# Patient Record
Sex: Male | Born: 1935 | Race: White | Hispanic: No | Smoking: Former smoker
Health system: Southern US, Community
[De-identification: ages and names within clinical notes are randomized; demographics above are authoritative.]

## PROBLEM LIST (undated history)

## (undated) DIAGNOSIS — F0390 Unspecified dementia without behavioral disturbance: Secondary | ICD-10-CM

## (undated) DIAGNOSIS — Z950 Presence of cardiac pacemaker: Secondary | ICD-10-CM

## (undated) DIAGNOSIS — J449 Chronic obstructive pulmonary disease, unspecified: Secondary | ICD-10-CM

## (undated) DIAGNOSIS — N390 Urinary tract infection, site not specified: Secondary | ICD-10-CM

## (undated) DIAGNOSIS — I671 Cerebral aneurysm, nonruptured: Secondary | ICD-10-CM

## (undated) DIAGNOSIS — I509 Heart failure, unspecified: Secondary | ICD-10-CM

## (undated) DIAGNOSIS — IMO0002 Reserved for concepts with insufficient information to code with codable children: Secondary | ICD-10-CM

## (undated) DIAGNOSIS — Y92009 Unspecified place in unspecified non-institutional (private) residence as the place of occurrence of the external cause: Secondary | ICD-10-CM

## (undated) DIAGNOSIS — I2510 Atherosclerotic heart disease of native coronary artery without angina pectoris: Secondary | ICD-10-CM

## (undated) DIAGNOSIS — I38 Endocarditis, valve unspecified: Secondary | ICD-10-CM

## (undated) DIAGNOSIS — M549 Dorsalgia, unspecified: Secondary | ICD-10-CM

## (undated) DIAGNOSIS — I4891 Unspecified atrial fibrillation: Secondary | ICD-10-CM

## (undated) DIAGNOSIS — M1990 Unspecified osteoarthritis, unspecified site: Secondary | ICD-10-CM

## (undated) DIAGNOSIS — F17210 Nicotine dependence, cigarettes, uncomplicated: Secondary | ICD-10-CM

## (undated) DIAGNOSIS — N400 Enlarged prostate without lower urinary tract symptoms: Secondary | ICD-10-CM

## (undated) DIAGNOSIS — I219 Acute myocardial infarction, unspecified: Secondary | ICD-10-CM

## (undated) DIAGNOSIS — S065X9A Traumatic subdural hemorrhage with loss of consciousness of unspecified duration, initial encounter: Secondary | ICD-10-CM

## (undated) DIAGNOSIS — Z85828 Personal history of other malignant neoplasm of skin: Secondary | ICD-10-CM

## (undated) DIAGNOSIS — D510 Vitamin B12 deficiency anemia due to intrinsic factor deficiency: Secondary | ICD-10-CM

## (undated) DIAGNOSIS — W19XXXA Unspecified fall, initial encounter: Secondary | ICD-10-CM

## (undated) DIAGNOSIS — S72009A Fracture of unspecified part of neck of unspecified femur, initial encounter for closed fracture: Secondary | ICD-10-CM

## (undated) DIAGNOSIS — K219 Gastro-esophageal reflux disease without esophagitis: Secondary | ICD-10-CM

## (undated) DIAGNOSIS — S065XAA Traumatic subdural hemorrhage with loss of consciousness status unknown, initial encounter: Secondary | ICD-10-CM

## (undated) DIAGNOSIS — I10 Essential (primary) hypertension: Secondary | ICD-10-CM

## (undated) HISTORY — DX: Acute myocardial infarction, unspecified: I21.9

## (undated) HISTORY — PX: HERNIA REPAIR: SHX51

## (undated) HISTORY — PX: ANGIOPLASTY: SHX39

## (undated) HISTORY — PX: CORONARY ANGIOPLASTY: SHX604

## (undated) HISTORY — DX: Benign prostatic hyperplasia without lower urinary tract symptoms: N40.0

## (undated) HISTORY — DX: Essential (primary) hypertension: I10

## (undated) HISTORY — DX: Chronic obstructive pulmonary disease, unspecified: J44.9

## (undated) HISTORY — DX: Urinary tract infection, site not specified: N39.0

## (undated) HISTORY — DX: Unspecified fall, initial encounter: W19.XXXA

## (undated) HISTORY — DX: Gastro-esophageal reflux disease without esophagitis: K21.9

## (undated) HISTORY — PX: FINGER AMPUTATION: SHX636

## (undated) HISTORY — DX: Vitamin B12 deficiency anemia due to intrinsic factor deficiency: D51.0

## (undated) HISTORY — PX: CARDIAC CATHETERIZATION: SHX172

## (undated) HISTORY — DX: Unspecified place in unspecified non-institutional (private) residence as the place of occurrence of the external cause: Y92.009

## (undated) HISTORY — DX: Fracture of unspecified part of neck of unspecified femur, initial encounter for closed fracture: S72.009A

## (undated) HISTORY — PX: BURR HOLE FOR SUBDURAL HEMATOMA: SHX1275

## (undated) HISTORY — DX: Unspecified dementia, unspecified severity, without behavioral disturbance, psychotic disturbance, mood disturbance, and anxiety: F03.90

## (undated) HISTORY — PX: PARTIAL GASTRECTOMY: SHX2172

## (undated) HISTORY — DX: Traumatic subdural hemorrhage with loss of consciousness status unknown, initial encounter: S06.5XAA

## (undated) HISTORY — DX: Endocarditis, valve unspecified: I38

## (undated) HISTORY — DX: Reserved for concepts with insufficient information to code with codable children: IMO0002

## (undated) HISTORY — DX: Unspecified osteoarthritis, unspecified site: M19.90

## (undated) HISTORY — DX: Presence of cardiac pacemaker: Z95.0

## (undated) HISTORY — PX: HIP SURGERY: SHX245

## (undated) HISTORY — PX: CHOLECYSTECTOMY: SHX55

## (undated) HISTORY — DX: Dorsalgia, unspecified: M54.9

## (undated) HISTORY — PX: OTHER SURGICAL HISTORY: SHX169

## (undated) HISTORY — DX: Nicotine dependence, cigarettes, uncomplicated: F17.210

## (undated) HISTORY — PX: INSERT / REPLACE / REMOVE PACEMAKER: SUR710

## (undated) HISTORY — PX: PACEMAKER PLACEMENT: SHX43

## (undated) HISTORY — DX: Atherosclerotic heart disease of native coronary artery without angina pectoris: I25.10

## (undated) HISTORY — DX: Unspecified atrial fibrillation: I48.91

## (undated) HISTORY — DX: Cerebral aneurysm, nonruptured: I67.1

## (undated) HISTORY — DX: Heart failure, unspecified: I50.9

## (undated) HISTORY — DX: Personal history of other malignant neoplasm of skin: Z85.828

## (undated) HISTORY — DX: Traumatic subdural hemorrhage with loss of consciousness of unspecified duration, initial encounter: S06.5X9A

---

## 2012-10-27 ENCOUNTER — Encounter: Payer: Self-pay | Admitting: Family Medicine

## 2012-12-18 ENCOUNTER — Encounter (HOSPITAL_COMMUNITY): Payer: Self-pay | Admitting: *Deleted

## 2012-12-18 ENCOUNTER — Emergency Department (HOSPITAL_COMMUNITY)
Admission: EM | Admit: 2012-12-18 | Discharge: 2012-12-18 | Disposition: A | Discharge status: Home / Self Care | Payer: Medicare Other | Attending: Emergency Medicine | Admitting: Emergency Medicine

## 2012-12-18 ENCOUNTER — Emergency Department (HOSPITAL_COMMUNITY): Payer: Medicare Other

## 2012-12-18 DIAGNOSIS — J441 Chronic obstructive pulmonary disease with (acute) exacerbation: Secondary | ICD-10-CM

## 2012-12-18 DIAGNOSIS — K219 Gastro-esophageal reflux disease without esophagitis: Secondary | ICD-10-CM | POA: Insufficient documentation

## 2012-12-18 DIAGNOSIS — F0390 Unspecified dementia without behavioral disturbance: Secondary | ICD-10-CM | POA: Insufficient documentation

## 2012-12-18 DIAGNOSIS — I4891 Unspecified atrial fibrillation: Secondary | ICD-10-CM | POA: Insufficient documentation

## 2012-12-18 DIAGNOSIS — I10 Essential (primary) hypertension: Secondary | ICD-10-CM | POA: Insufficient documentation

## 2012-12-18 DIAGNOSIS — Z9861 Coronary angioplasty status: Secondary | ICD-10-CM | POA: Insufficient documentation

## 2012-12-18 DIAGNOSIS — F17200 Nicotine dependence, unspecified, uncomplicated: Secondary | ICD-10-CM | POA: Insufficient documentation

## 2012-12-18 DIAGNOSIS — Z85828 Personal history of other malignant neoplasm of skin: Secondary | ICD-10-CM | POA: Insufficient documentation

## 2012-12-18 DIAGNOSIS — Z8679 Personal history of other diseases of the circulatory system: Secondary | ICD-10-CM | POA: Insufficient documentation

## 2012-12-18 DIAGNOSIS — I2510 Atherosclerotic heart disease of native coronary artery without angina pectoris: Secondary | ICD-10-CM | POA: Insufficient documentation

## 2012-12-18 DIAGNOSIS — I252 Old myocardial infarction: Secondary | ICD-10-CM | POA: Insufficient documentation

## 2012-12-18 DIAGNOSIS — Z880 Allergy status to penicillin: Secondary | ICD-10-CM | POA: Insufficient documentation

## 2012-12-18 DIAGNOSIS — Z862 Personal history of diseases of the blood and blood-forming organs and certain disorders involving the immune mechanism: Secondary | ICD-10-CM | POA: Insufficient documentation

## 2012-12-18 DIAGNOSIS — M129 Arthropathy, unspecified: Secondary | ICD-10-CM | POA: Insufficient documentation

## 2012-12-18 DIAGNOSIS — R059 Cough, unspecified: Secondary | ICD-10-CM | POA: Insufficient documentation

## 2012-12-18 DIAGNOSIS — IMO0002 Reserved for concepts with insufficient information to code with codable children: Secondary | ICD-10-CM | POA: Insufficient documentation

## 2012-12-18 DIAGNOSIS — Z8739 Personal history of other diseases of the musculoskeletal system and connective tissue: Secondary | ICD-10-CM | POA: Insufficient documentation

## 2012-12-18 DIAGNOSIS — Z950 Presence of cardiac pacemaker: Secondary | ICD-10-CM | POA: Insufficient documentation

## 2012-12-18 DIAGNOSIS — Z79899 Other long term (current) drug therapy: Secondary | ICD-10-CM | POA: Insufficient documentation

## 2012-12-18 DIAGNOSIS — I509 Heart failure, unspecified: Secondary | ICD-10-CM | POA: Insufficient documentation

## 2012-12-18 DIAGNOSIS — R05 Cough: Secondary | ICD-10-CM | POA: Insufficient documentation

## 2012-12-18 DIAGNOSIS — J438 Other emphysema: Secondary | ICD-10-CM | POA: Insufficient documentation

## 2012-12-18 DIAGNOSIS — Z87442 Personal history of urinary calculi: Secondary | ICD-10-CM | POA: Insufficient documentation

## 2012-12-18 DIAGNOSIS — Z87448 Personal history of other diseases of urinary system: Secondary | ICD-10-CM | POA: Insufficient documentation

## 2012-12-18 LAB — CBC WITH DIFFERENTIAL/PLATELET
Basophils Absolute: 0 10*3/uL (ref 0.0–0.1)
Eosinophils Relative: 6 % — ABNORMAL HIGH (ref 0–5)
HCT: 36 % — ABNORMAL LOW (ref 39.0–52.0)
Hemoglobin: 12.7 g/dL — ABNORMAL LOW (ref 13.0–17.0)
Lymphocytes Relative: 13 % (ref 12–46)
Lymphs Abs: 1.1 10*3/uL (ref 0.7–4.0)
MCV: 92.5 fL (ref 78.0–100.0)
Monocytes Absolute: 0.8 10*3/uL (ref 0.1–1.0)
Monocytes Relative: 9 % (ref 3–12)
Neutro Abs: 6.1 10*3/uL (ref 1.7–7.7)
RDW: 14.9 % (ref 11.5–15.5)
WBC: 8.4 10*3/uL (ref 4.0–10.5)

## 2012-12-18 LAB — BASIC METABOLIC PANEL
BUN: 18 mg/dL (ref 6–23)
Calcium: 8.5 mg/dL (ref 8.4–10.5)
Chloride: 104 mEq/L (ref 96–112)
Creatinine, Ser: 1.06 mg/dL (ref 0.50–1.35)
GFR calc Af Amer: 70 mL/min — ABNORMAL LOW (ref >90)

## 2012-12-18 LAB — LACTIC ACID, PLASMA: Lactic Acid, Venous: 1.5 mmol/L (ref 0.5–2.2)

## 2012-12-18 MED ORDER — LEVOFLOXACIN 500 MG PO TABS
500.0000 mg | ORAL_TABLET | Freq: Once | ORAL | Status: AC
  Administered 2012-12-18: 500 mg via ORAL
  Filled 2012-12-18: qty 1

## 2012-12-18 MED ORDER — IPRATROPIUM BROMIDE 0.02 % IN SOLN
0.5000 mg | Freq: Once | RESPIRATORY_TRACT | Status: AC
  Administered 2012-12-18: 0.5 mg via RESPIRATORY_TRACT
  Filled 2012-12-18: qty 2.5

## 2012-12-18 MED ORDER — ALBUTEROL SULFATE (5 MG/ML) 0.5% IN NEBU
5.0000 mg | INHALATION_SOLUTION | Freq: Once | RESPIRATORY_TRACT | Status: AC
  Administered 2012-12-18: 5 mg via RESPIRATORY_TRACT
  Filled 2012-12-18: qty 1

## 2012-12-18 MED ORDER — LEVOFLOXACIN 500 MG PO TABS: 500.0000 mg | ORAL_TABLET | Freq: Once | ORAL | Status: DC

## 2012-12-18 MED ORDER — PREDNISONE 10 MG PO TABS: 60.0000 mg | ORAL_TABLET | Freq: Every day | ORAL | Status: DC

## 2012-12-18 MED ORDER — PREDNISONE 20 MG PO TABS
60.0000 mg | ORAL_TABLET | Freq: Once | ORAL | Status: AC
  Administered 2012-12-18: 60 mg via ORAL
  Filled 2012-12-18: qty 3

## 2012-12-18 NOTE — ED Notes (Signed)
Pt's family reports progressively worse shortness of breath x2 days, productive cough w/ clear/yellow sputum. Pt w/o any known fever. Pt denies chest pain. Pt is A&OX4, in no acute distress.

## 2012-12-18 NOTE — ED Notes (Signed)
See this RN's triage note.  

## 2012-12-18 NOTE — ED Notes (Signed)
D/c instructions reviewed w/ pt and family - pt and family deny any further questions or concerns at present. Pt ambulating independently w/ steady gait on d/c in no acute distress, A&Ox4. Rx given x2  

## 2012-12-18 NOTE — ED Provider Notes (Signed)
CSN: [id number]     Arrival date & time 12/18/12  4098 History   First MD Initiated Contact with Patient 12/18/12 0354     Chief Complaint  Patient presents with  . Shortness of Breath    The history is provided by the patient and a relative.   patient reports worsening shortness breath her the past 48 hours.  He said a productive cough.  He has a history of COPD/emphysema.  He continues to smoke cigarettes.  He tried his breathing treatment without improvement.  He has not seen a primary care physician.  Patient has a demand pacer and history of A. fib.  He denies orthopnea.  No unilateral leg swelling.  Denies nausea vomiting or diarrhea.  No fevers or chills at home.  Past Medical History  Diagnosis Date  . A-fib   . Pacemaker   . CAD (coronary artery disease)     s/p stent placement  . CHF (congestive heart failure)     EF 50% on echo 2013, 44% on nuclear stufy 07/2011  . Anemia, pernicious   . Arthritis   . Back pain   . BPH (benign prostatic hyperplasia)   . Cerebral aneurysm   . COPD (chronic obstructive pulmonary disease)   . DDD (degenerative disc disease)   . GERD (gastroesophageal reflux disease)     h/o peptic ulcer  . HTN (hypertension)   . Renal stones   . Subdural hematoma   . History of skin cancer   . Cigarette smoker   . Valvular disease     MR/TR on echo prev  . Dementia   . MI (myocardial infarction)    Past Surgical History  Procedure Laterality Date  . Finger amputation      traumatic  . Angioplasty    . Hernia repair    . Burr hole for subdural hematoma      with evacuation  . Pacemaker placement      biotronik 10/12/09  . Transurethral resection of prostate    . Partial gastrectomy    . Coronary artery bypass graft     History reviewed. No pertinent family history. History  Substance Use Topics  . Smoking status: Current Every Day Smoker -- 1.00 packs/day    Types: Cigarettes  . Smokeless tobacco: Not on file  . Alcohol Use: No     Review of Systems  Respiratory: Positive for shortness of breath.   All other systems reviewed and are negative.    Allergies  Codeine; Flecainide; Morphine and related; Multaq; and Penicillins  Home Medications   Current Outpatient Rx  Name  Route  Sig  Dispense  Refill  . albuterol (PROVENTIL HFA;VENTOLIN HFA) 108 (90 BASE) MCG/ACT inhaler   Inhalation   Inhale 2 puffs into the lungs every 6 (six) hours as needed for wheezing.         . digoxin (LANOXIN) 0.125 MG tablet   Oral   Take 0.125 mg by mouth daily.         Marland Kitchen diltiazem (CARDIZEM CD) 120 MG 24 hr capsule   Oral   Take 120 mg by mouth daily.         Marland Kitchen docusate sodium (COLACE) 100 MG capsule   Oral   Take 100 mg by mouth 2 (two) times daily.         Marland Kitchen donepezil (ARICEPT) 5 MG tablet   Oral   Take 5 mg by mouth at bedtime.         Marland Kitchen  metoprolol succinate (TOPROL-XL) 25 MG 24 hr tablet   Oral   Take 25 mg by mouth daily.         . nabumetone (RELAFEN) 500 MG tablet   Oral   Take 500 mg by mouth 2 (two) times daily.         . nitroGLYCERIN (NITROSTAT) 0.4 MG SL tablet   Sublingual   Place 0.4 mg under the tongue every 5 (five) minutes as needed for chest pain.         . Probiotic Product ([employer] DIGESTIVE PROBIOTIC PO)   Oral   Take 1 capsule by mouth daily.         . ranitidine (ZANTAC) 150 MG tablet   Oral   Take 150 mg by mouth daily.         . tamsulosin (FLOMAX) 0.4 MG CAPS capsule   Oral   Take 0.4 mg by mouth daily.         Marland Kitchen levofloxacin (LEVAQUIN) 500 MG tablet   Oral   Take 1 tablet (500 mg total) by mouth once.   7 tablet   0   . predniSONE (DELTASONE) 10 MG tablet   Oral   Take 6 tablets (60 mg total) by mouth daily.   30 tablet   0    BP 105/80  Pulse 115  Temp(Src) 97.6 F (36.4 C) (Rectal)  Resp 13  SpO2 94% Physical Exam  Nursing note and vitals reviewed. Constitutional: He is oriented to person, place, and time. He appears well-developed and  well-nourished.  HENT:  Head: Normocephalic and atraumatic.  Eyes: EOM are normal.  Neck: Normal range of motion.  Cardiovascular: Normal rate, regular rhythm, normal heart sounds and intact distal pulses.   Pulmonary/Chest: Effort normal. No respiratory distress. He has wheezes.  Abdominal: Soft. He exhibits no distension. There is no tenderness.  Genitourinary: Rectum normal.  Musculoskeletal: Normal range of motion.  Neurological: He is alert and oriented to person, place, and time.  Skin: Skin is warm and dry.  Psychiatric: He has a normal mood and affect. Judgment normal.    ED Course  Procedures (including critical care time)  Date: 12/18/2012  Rate: 122  Rhythm: Atrial fibrillation with rapid ventricular response.   QRS Axis: normal  Intervals: normal  ST/T Wave abnormalities: normal  Conduction Disutrbances: none  Narrative Interpretation: Several episodes of demand pacing.  Old EKG Reviewed: No significant changes noted     Labs Review Labs Reviewed  CBC WITH DIFFERENTIAL - Abnormal; Notable for the following:    RBC 3.89 (*)    Hemoglobin 12.7 (*)    HCT 36.0 (*)    Eosinophils Relative 6 (*)    All other components within normal limits  BASIC METABOLIC PANEL - Abnormal; Notable for the following:    Glucose, Bld 106 (*)    GFR calc non Af Amer 61 (*)    GFR calc Af Amer 70 (*)    All other components within normal limits  CULTURE, BLOOD (ROUTINE X 2)  CULTURE, BLOOD (ROUTINE X 2)  LACTIC ACID, PLASMA  TROPONIN I   Imaging Review Dg Chest 2 View  12/18/2012   *RADIOLOGY REPORT*  Clinical Data: Shortness of breath, cough  CHEST - 2 VIEW  Comparison: None.  Findings: Heart size upper normal to mildly enlarged. Left chest wall battery pack with lead tips projecting over the right atrium and right ventricle.  Central vascular / arterial prominence. Aortic tortuosity and atherosclerosis.  Interstitial  coarsening and hyperinflation.  Mild left lung base opacity  and trace left pleural fluid versus pleural thickening. No pneumothorax.  Diffuse osteopenia.  Surgical clips project at the GE junction.  IMPRESSION: Interstitial prominence is at least in part secondary to COPD/chronic change.  Mild superimposed interstitial edema not excluded in the appropriate clinical setting.  Left lung base opacity; favor scarring or atelectasis.   Original Report Authenticated By: Jearld Lesch, M.D.  I personally reviewed the imaging tests through PACS system I reviewed available ER/hospitalization records through the EMR   MDM   1. COPD exacerbation    5:36 AM Patient feels much better at this time.  Discharge home in good condition.  He feels better after the albuterol.  He has albuterol at home.  Home with steroids and short course of Levaquin.  Levaquin as the patient likely has bronchitis and could have developing pneumonia.  I think the patient is a good candidate for discharge home and he will return to the ER for new or worsening symptoms.  At time of discharge the patient's heart rate has improved.  His current heart rate is 90 appears to be irregular on the monitor.    Lyanne Co, MD 12/18/12 [phone]

## 2012-12-24 LAB — CULTURE, BLOOD (ROUTINE X 2): Culture: NO GROWTH

## 2013-02-04 ENCOUNTER — Ambulatory Visit (INDEPENDENT_AMBULATORY_CARE_PROVIDER_SITE_OTHER): Payer: Medicare Other | Admitting: Family Medicine

## 2013-02-04 ENCOUNTER — Encounter: Payer: Self-pay | Admitting: Family Medicine

## 2013-02-04 VITALS — BP 140/60 | HR 97 | Temp 98.4°F | Ht 69.0 in | Wt 145.0 lb

## 2013-02-04 DIAGNOSIS — I25810 Atherosclerosis of coronary artery bypass graft(s) without angina pectoris: Secondary | ICD-10-CM

## 2013-02-04 DIAGNOSIS — Z23 Encounter for immunization: Secondary | ICD-10-CM

## 2013-02-04 DIAGNOSIS — Z950 Presence of cardiac pacemaker: Secondary | ICD-10-CM

## 2013-02-04 DIAGNOSIS — I4891 Unspecified atrial fibrillation: Secondary | ICD-10-CM

## 2013-02-04 DIAGNOSIS — M51369 Other intervertebral disc degeneration, lumbar region without mention of lumbar back pain or lower extremity pain: Secondary | ICD-10-CM

## 2013-02-04 DIAGNOSIS — F17200 Nicotine dependence, unspecified, uncomplicated: Secondary | ICD-10-CM

## 2013-02-04 DIAGNOSIS — N400 Enlarged prostate without lower urinary tract symptoms: Secondary | ICD-10-CM

## 2013-02-04 DIAGNOSIS — J449 Chronic obstructive pulmonary disease, unspecified: Secondary | ICD-10-CM

## 2013-02-04 DIAGNOSIS — M5137 Other intervertebral disc degeneration, lumbosacral region: Secondary | ICD-10-CM

## 2013-02-04 DIAGNOSIS — M5136 Other intervertebral disc degeneration, lumbar region: Secondary | ICD-10-CM

## 2013-02-04 DIAGNOSIS — F0390 Unspecified dementia without behavioral disturbance: Secondary | ICD-10-CM

## 2013-02-04 MED ORDER — DILTIAZEM HCL ER COATED BEADS 120 MG PO CP24: 120.0000 mg | ORAL_CAPSULE | Freq: Every day | ORAL | Status: DC

## 2013-02-04 MED ORDER — RANITIDINE HCL 150 MG PO TABS: 150.0000 mg | ORAL_TABLET | Freq: Every day | ORAL | Status: DC

## 2013-02-04 MED ORDER — DONEPEZIL HCL 5 MG PO TABS: 5.0000 mg | ORAL_TABLET | Freq: Every day | ORAL | Status: DC

## 2013-02-04 MED ORDER — TAMSULOSIN HCL 0.4 MG PO CAPS: 0.4000 mg | ORAL_CAPSULE | Freq: Every day | ORAL | Status: DC

## 2013-02-04 MED ORDER — DIGOXIN 125 MCG PO TABS: 0.1250 mg | ORAL_TABLET | Freq: Every day | ORAL | Status: DC

## 2013-02-04 MED ORDER — METOPROLOL SUCCINATE ER 25 MG PO TB24: 12.5000 mg | ORAL_TABLET | Freq: Two times a day (BID) | ORAL | Status: DC

## 2013-02-04 MED ORDER — ALBUTEROL SULFATE HFA 108 (90 BASE) MCG/ACT IN AERS: 2.0000 | INHALATION_SPRAY | Freq: Four times a day (QID) | RESPIRATORY_TRACT | Status: DC | PRN

## 2013-02-04 NOTE — Patient Instructions (Signed)
Go to the lab on the way out.  We'll contact you with your lab report. Go see Shirlee Limerick about your referral. Try to limit the aleve.  Take it with food.  Take care.

## 2013-02-04 NOTE — Progress Notes (Signed)
New patient to est care.   H/o CAD with prev stent, AF and pacer.  Exercise tolerance is at baseline, not low given his age and COPD and OA/DDD in his back.  No CP currently.  Still smoking, no plans to quit.  No BLE edema.  Compliant with meds.  Needs to est care with cards.  Recently moved here to live with son.  EF 60% on echo 2013 per old records.   H/o dementia, worsened short term memory in the last 1-2 years.  On aricept and compliant.  Son helps with meds, supervises patient. No falls.    BPH per records with some nocturia. Pt already on flomax, no pain with urination.   PMH and SH reviewed  ROS: See HPI, otherwise noncontributory.  Meds, vitals, and allergies reviewed.   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

## 2013-02-05 ENCOUNTER — Encounter: Payer: Self-pay | Admitting: Family Medicine

## 2013-02-05 DIAGNOSIS — I2510 Atherosclerotic heart disease of native coronary artery without angina pectoris: Secondary | ICD-10-CM | POA: Insufficient documentation

## 2013-02-05 DIAGNOSIS — J449 Chronic obstructive pulmonary disease, unspecified: Secondary | ICD-10-CM | POA: Insufficient documentation

## 2013-02-05 DIAGNOSIS — N400 Enlarged prostate without lower urinary tract symptoms: Secondary | ICD-10-CM | POA: Insufficient documentation

## 2013-02-05 DIAGNOSIS — F0390 Unspecified dementia without behavioral disturbance: Secondary | ICD-10-CM | POA: Insufficient documentation

## 2013-02-05 DIAGNOSIS — M5136 Other intervertebral disc degeneration, lumbar region: Secondary | ICD-10-CM | POA: Insufficient documentation

## 2013-02-05 DIAGNOSIS — F17200 Nicotine dependence, unspecified, uncomplicated: Secondary | ICD-10-CM | POA: Insufficient documentation

## 2013-02-05 DIAGNOSIS — I4891 Unspecified atrial fibrillation: Secondary | ICD-10-CM | POA: Insufficient documentation

## 2013-02-05 DIAGNOSIS — Z950 Presence of cardiac pacemaker: Secondary | ICD-10-CM | POA: Insufficient documentation

## 2013-02-05 NOTE — Assessment & Plan Note (Signed)
Refer to cards. 

## 2013-02-05 NOTE — Assessment & Plan Note (Signed)
Continue prn SABA for now.

## 2013-02-05 NOTE — Assessment & Plan Note (Signed)
Discussed, unlikely to quit.

## 2013-02-05 NOTE — Assessment & Plan Note (Signed)
Continue current med.  Son supervises patient.  Will follow.

## 2013-02-05 NOTE — Assessment & Plan Note (Signed)
Continue current med 

## 2013-02-05 NOTE — Assessment & Plan Note (Signed)
Continue prn aleve with GI caution, continue back brace- has worn for years.

## 2013-02-07 ENCOUNTER — Telehealth: Payer: Self-pay | Admitting: *Deleted

## 2013-02-07 ENCOUNTER — Encounter: Payer: Self-pay | Admitting: Cardiovascular Disease

## 2013-02-07 ENCOUNTER — Ambulatory Visit (INDEPENDENT_AMBULATORY_CARE_PROVIDER_SITE_OTHER): Payer: Medicare Other | Admitting: Cardiovascular Disease

## 2013-02-07 VITALS — BP 92/60 | HR 92 | Ht 68.0 in | Wt 146.5 lb

## 2013-02-07 DIAGNOSIS — I4891 Unspecified atrial fibrillation: Secondary | ICD-10-CM

## 2013-02-07 DIAGNOSIS — Z950 Presence of cardiac pacemaker: Secondary | ICD-10-CM

## 2013-02-07 DIAGNOSIS — I2510 Atherosclerotic heart disease of native coronary artery without angina pectoris: Secondary | ICD-10-CM

## 2013-02-07 NOTE — Assessment & Plan Note (Signed)
He has no symptoms of angina. Continue medical therapy. We're going to request his cardiac records.

## 2013-02-07 NOTE — Assessment & Plan Note (Signed)
On referring him to Dr. Graciela Husbands to establish care and check his device.

## 2013-02-07 NOTE — Patient Instructions (Addendum)
Continue same medications.   Request cardiac record. Call [phone] to let us know the phone number to get pacemaker records.  Schedule an appointment for a pacemaker check (new patient)/[email or web address]  Your physician wants you to follow-up in: 6 months.  You will receive a reminder letter in the mail two months in advance. If you don't receive a letter, please call our office to schedule the follow-up appointment.

## 2013-02-07 NOTE — Progress Notes (Signed)
Primary care physician: Dr. Para March  HPI  This is an 79 year old man who is here today to establish cardiovascular care. He moved from [place], [place] to live with his son. He has known history of coronary artery disease with previous stenting, atrial fibrillation, permanent pacemaker placement, COPD with active tobacco use and dementia. His cardiac records are not available. It appears that he was on anticoagulation in the past with warfarin but was discontinued. The son does not know the reasons.   EF 60% on echo 2013 per old records.  H/o dementia, worsened short term memory in the last 1-2 years. On aricept and compliant. Son helps with meds, supervises patient. No falls.  The patient is overall a poor historian but denies chest pain, orthopnea, dizziness or syncope. He has baseline shortness of breath related to COPD.   Allergies  Allergen Reactions  . Codeine   . Flecainide     H/o CAD  . Morphine And Related   . Multaq [Dronedarone]   . Penicillins      Current Outpatient Prescriptions on File Prior to Visit  Medication Sig Dispense Refill  . albuterol (PROVENTIL HFA;VENTOLIN HFA) 108 (90 BASE) MCG/ACT inhaler Inhale 2 puffs into the lungs every 6 (six) hours as needed for wheezing.  18 g  5  . aspirin 81 MG tablet Take 81 mg by mouth daily.      . Cholecalciferol (D3-1000) 1000 UNITS capsule Take 1,000 Units by mouth daily.      . digoxin (LANOXIN) 0.125 MG tablet Take 1 tablet (0.125 mg total) by mouth daily.  30 tablet  12  . diltiazem (CARDIZEM CD) 120 MG 24 hr capsule Take 1 capsule (120 mg total) by mouth daily.  30 capsule  12  . docusate sodium (COLACE) 100 MG capsule Take 100 mg by mouth 2 (two) times daily.      Marland Kitchen donepezil (ARICEPT) 5 MG tablet Take 1 tablet (5 mg total) by mouth at bedtime.  30 tablet  12  . metoprolol succinate (TOPROL-XL) 25 MG 24 hr tablet Take 0.5 tablets (12.5 mg total) by mouth 2 (two) times daily.  30 tablet  12  . Multiple Vitamin  (MULTIVITAMIN) capsule Take 1 capsule by mouth daily.      . naproxen sodium (ANAPROX) 220 MG tablet Take 220 mg by mouth 2 (two) times daily as needed (with a meal).      . nitroGLYCERIN (NITROSTAT) 0.4 MG SL tablet Place 0.4 mg under the tongue every 5 (five) minutes as needed for chest pain.      . Probiotic Product ([employer]S DIGESTIVE PROBIOTIC PO) Take 1 capsule by mouth daily.      . ranitidine (ZANTAC) 150 MG tablet Take 1 tablet (150 mg total) by mouth daily.  30 tablet  12  . tamsulosin (FLOMAX) 0.4 MG CAPS capsule Take 1 capsule (0.4 mg total) by mouth daily.  30 capsule  12   No current facility-administered medications on file prior to visit.     Past Medical History  Diagnosis Date  . A-fib   . Pacemaker   . CHF (congestive heart failure)     EF 50% on echo 2013, 44% on nuclear stufy 07/2011  . Anemia, pernicious   . Arthritis   . Back pain   . Cerebral aneurysm   . COPD (chronic obstructive pulmonary disease)   . DDD (degenerative disc disease)   . GERD (gastroesophageal reflux disease)     h/o peptic ulcer  .  HTN (hypertension)   . Subdural hematoma   . History of skin cancer   . Cigarette smoker   . Valvular disease     MR/TR on echo prev  . Dementia   . MI (myocardial infarction)   . BPH (benign prostatic hyperplasia)   . CAD (coronary artery disease)     s/p stent placement     Past Surgical History  Procedure Laterality Date  . Finger amputation      traumatic  . Angioplasty    . Hernia repair    . Burr hole for subdural hematoma      with evacuation  . Pacemaker placement      biotronik 10/12/09  . Partial gastrectomy    . Cholecystectomy    . Insert / replace / remove pacemaker    . Cardiac catheterization    . Coronary angioplasty       Family History  Problem Relation Age of Onset  . Dementia Mother   . Heart disease Father   . Heart disease Brother      History   Social History  . Marital Status: Widowed    Spouse Name: N/A     Number of Children: N/A  . Years of Education: N/A   Occupational History  . Not on file.   Social History Main Topics  . Smoking status: Current Every Day Smoker -- 0.50 packs/day for 65 years    Types: Cigarettes    Start date: 04/24/1950  . Smokeless tobacco: Never Used  . Alcohol Use: No  . Drug Use: No  . Sexual Activity: Not on file   Other Topics Concern  . 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     Widowed 2011     ROS A 10 point review of system was performed. It is negative other than that mentioned in the history of present illness.   PHYSICAL EXAM   BP 92/60  Pulse 92  Ht 5\' 8"  (1.727 m)  Wt 146 lb 8 oz (66.452 kg)  BMI 22.28 kg/m2 Constitutional: He is oriented to person, place, and time. He appears frail. No distress.  HENT: No nasal discharge.  Head: Normocephalic and atraumatic.  Eyes: Pupils are equal and round.  No discharge. Neck: Normal range of motion. Neck supple. No JVD present. No thyromegaly present.  Cardiovascular: Normal rate, regular rhythm, normal heart sounds. Exam reveals no gallop and no friction rub. No murmur heard.  Pulmonary/Chest: Effort normal and breath sounds normal. No stridor. No respiratory distress. He has no wheezes. He has no rales. He exhibits no tenderness.  Abdominal: Soft. Bowel sounds are normal. He exhibits no distension. There is no tenderness. There is no rebound and no guarding.  Musculoskeletal: Normal range of motion. He exhibits no edema and no tenderness.  Neurological: He is alert and oriented to person, place, and time. Coordination normal.  Skin: Skin is warm and dry. Thin skin with bruising. He is not diaphoretic. No erythema. No pallor.  Psychiatric:  His behavior is normal. Judgment and thought content  or abnormal .       ZOX:[id number] ventricular pacemaker  - Possible underlying atrial fibrillation   ASSESSMENT AND PLAN

## 2013-02-07 NOTE — Telephone Encounter (Signed)
[facility] # [phone] fax# [phone]

## 2013-02-07 NOTE — Assessment & Plan Note (Signed)
He is on rate control with digoxin, diltiazem and metoprolol. His blood pressure is low. Not sure why he is not on anticoagulation but will request cardiac records. Continue aspirin for now.

## 2013-02-10 ENCOUNTER — Ambulatory Visit: Payer: Medicare Other | Admitting: Family Medicine

## 2013-02-10 NOTE — Telephone Encounter (Addendum)
Called patient's son and advised that I called [facility] and they need the patient to sign a release of information form prior to sending [place] records. Patient's son states that he will come by today to sign a release so we can obtain these records prior to the visit with Dr.Klein this week for his device check.

## 2013-02-13 ENCOUNTER — Ambulatory Visit (INDEPENDENT_AMBULATORY_CARE_PROVIDER_SITE_OTHER): Payer: Medicare Other | Admitting: Internal Medicine

## 2013-02-13 ENCOUNTER — Telehealth: Payer: Self-pay | Admitting: *Deleted

## 2013-02-13 ENCOUNTER — Encounter: Payer: Self-pay | Admitting: Internal Medicine

## 2013-02-13 VITALS — BP 100/64 | HR 101 | Ht 72.0 in | Wt 148.8 lb

## 2013-02-13 DIAGNOSIS — I4891 Unspecified atrial fibrillation: Secondary | ICD-10-CM

## 2013-02-13 DIAGNOSIS — Z950 Presence of cardiac pacemaker: Secondary | ICD-10-CM

## 2013-02-13 LAB — PACEMAKER DEVICE OBSERVATION
ATRIAL PACING PM: 9
DEVICE MODEL PM: 66080046

## 2013-02-13 NOTE — Patient Instructions (Signed)
Your physician wants you to follow-up in: 6 months with Matthew Floyd for a device check & 1 year with Matthew Floyd. You will receive a reminder letter in the mail two months in advance. If you don't receive a letter, please call our office to schedule the follow-up appointment.  Your physician recommends that you continue on your current medications as directed. Please refer to the Current Medication list given to you today.

## 2013-02-13 NOTE — Telephone Encounter (Signed)
Dr. Kirke Corin and Dr. Graciela Husbands reviewed notes from [facility] regarding why the patient is not currently on blood thinner for his a-fib. Office notes state that he was a fall risk. After review from Dr Graciela Husbands and Dr. Kirke Corin, they both feel he needs to be on an anticoagulant. Per Dr. Kirke Corin, he would like to see the patient back in the next 2-3 weeks with his son to follow up about this. I have left a message with the patient's daughter in law to have her husband Onalee Hua) call back.

## 2013-02-13 NOTE — Assessment & Plan Note (Signed)
The device was reprogrammed to lower  Pacing rate from 80--70.  Rate response has been left active. He is in atrial fibrillation 100% of the time

## 2013-02-13 NOTE — Progress Notes (Signed)
ELECTROPHYSIOLOGY CONSULT NOTE  Patient ID: Matthew Floyd, MRN: [id number], DOB/AGE: 77/17/1924 77 y.o. Admit date: (Not on file) Date of Consult: 02/13/2013  Primary Physician: Crawford Givens, MD Primary Cardiologist:MA  Chief Complaint: pacemaker followup   HPI Matthew Floyd is a 79 y.o. male  Referred to establish pacemaker followup. Indications not available. Apparently he had atrial fibrillation and syncope. He received of Biotronik device 2011  He also has a history of atrial fibrillation and was previously anticoagulated this has been intercurrently discontinued. Past medical history refer to subdural hematoma  Ejection fraction was 60% by echo 2013.  He has mild-moderate dementia. There is no history of falls.  He does have a history of coronary disease with prior stenting.  He has COPD and continues to smoke. He has shortness of breath.  He denies chest pain. He has no peripheral edema.   Past Medical History  Diagnosis Date  . A-fib   . Pacemaker   . CHF (congestive heart failure)     EF 50% on echo 2013, 44% on nuclear stufy 07/2011  . Anemia, pernicious   . Arthritis   . Back pain   . Cerebral aneurysm   . COPD (chronic obstructive pulmonary disease)   . DDD (degenerative disc disease)   . GERD (gastroesophageal reflux disease)     h/o peptic ulcer  . HTN (hypertension)   . Subdural hematoma   . History of skin cancer   . Cigarette smoker   . Valvular disease     MR/TR on echo prev  . Dementia   . MI (myocardial infarction)   . BPH (benign prostatic hyperplasia)   . CAD (coronary artery disease)     s/p stent placement      Surgical History:  Past Surgical History  Procedure Laterality Date  . Finger amputation      traumatic  . Angioplasty    . Hernia repair    . Burr hole for subdural hematoma      with evacuation  . Pacemaker placement      biotronik 10/12/09  . Partial gastrectomy    . Cholecystectomy    . Cardiac catheterization    .  Coronary angioplasty    . Insert / replace / remove pacemaker       Home Meds: Prior to Admission medications   Medication Sig Start Date End Date Taking? Authorizing Provider  albuterol (PROVENTIL HFA;VENTOLIN HFA) 108 (90 BASE) MCG/ACT inhaler Inhale 2 puffs into the lungs every 6 (six) hours as needed for wheezing. 02/04/13  Yes Joaquim Nam, MD  aspirin 81 MG tablet Take 81 mg by mouth daily.   Yes Historical Provider, MD  Cholecalciferol (D3-1000) 1000 UNITS capsule Take 1,000 Units by mouth daily.   Yes Historical Provider, MD  digoxin (LANOXIN) 0.125 MG tablet Take 1 tablet (0.125 mg total) by mouth daily. 02/04/13  Yes Joaquim Nam, MD  diltiazem (CARDIZEM CD) 120 MG 24 hr capsule Take 1 capsule (120 mg total) by mouth daily. 02/04/13  Yes Joaquim Nam, MD  docusate sodium (COLACE) 100 MG capsule Take 100 mg by mouth 2 (two) times daily.   Yes Historical Provider, MD  donepezil (ARICEPT) 5 MG tablet Take 1 tablet (5 mg total) by mouth at bedtime. 02/04/13  Yes Joaquim Nam, MD  metoprolol succinate (TOPROL-XL) 25 MG 24 hr tablet Take 0.5 tablets (12.5 mg total) by mouth 2 (two) times daily. 02/04/13  Yes Joaquim Nam, MD  Multiple Vitamin (  MULTIVITAMIN) capsule Take 1 capsule by mouth daily.   Yes Historical Provider, MD  naproxen sodium (ANAPROX) 220 MG tablet Take 220 mg by mouth 2 (two) times daily as needed (with a meal).   Yes Historical Provider, MD  nitroGLYCERIN (NITROSTAT) 0.4 MG SL tablet Place 0.4 mg under the tongue every 5 (five) minutes as needed for chest pain.   Yes Historical Provider, MD  Probiotic Product ([employer] DIGESTIVE PROBIOTIC PO) Take 1 capsule by mouth daily.   Yes Historical Provider, MD  ranitidine (ZANTAC) 150 MG tablet Take 1 tablet (150 mg total) by mouth daily. 02/04/13  Yes Joaquim Nam, MD  tamsulosin (FLOMAX) 0.4 MG CAPS capsule Take 1 capsule (0.4 mg total) by mouth daily. 02/04/13  Yes Joaquim Nam, MD      Allergies:    Allergies  Allergen Reactions  . Codeine   . Flecainide     H/o CAD  . Morphine And Related   . Multaq [Dronedarone]   . Penicillins     History   Social History  . Marital Status: Widowed    Spouse Name: N/A    Number of Children: N/A  . Years of Education: N/A   Occupational History  . Not on file.   Social History Main Topics  . Smoking status: Current Every Day Smoker -- 0.50 packs/day for 65 years    Types: Cigarettes    Start date: 04/24/1952  . Smokeless tobacco: Never Used  . Alcohol Use: No  . Drug Use: No  . Sexual Activity: Not on file   Other Topics Concern  . 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     Widowed 2011     Family History  Problem Relation Age of Onset  . Dementia Mother   . Heart disease Father   . Heart disease Brother      ROS:  Please see the history of present illness.     All other systems reviewed and negative.    Physical Exam:   Blood pressure 100/64, pulse 101, height 6' (1.829 m), weight 148 lb 12 oz (67.473 kg). General: Well developed, cachectic  male in no acute distress. Head: Normocephalic, atraumatic, sclera non-icteric, no xanthomas, nares are without discharge.temporal wasting EENT: normal Lymph Nodes:  none Back: without scoliosis/kyphosis , no CVA tendersness Neck: Negative for carotid bruits. JVD not elevated. Lungs:decreased breath sounds bilaterally Heart: irregularly irregular with distant heart sounds no murmur , rubs, or gallops appreciated. Abdomen: Soft, non-tender, non-distended with normoactive bowel sounds. No hepatomegaly. No rebound/guarding. No obvious abdominal masses. Msk:  Strength and tone appear normal for age. Extremities: No clubbing or cyanosis. No  edema.  Distal pedal pulses are 2+ and equal bilaterally. Skin: Warm and Dry Neuro: Alert and oriented X 3. CN III-XII intact Grossly normal sensory and motor function . Psych:  Responds to  questions appropriately with a normal affect.      Labs: Cardiac Enzymes No results found for this basename: CKTOTAL, CKMB, TROPONINI,  in the last 72 hours CBC Lab Results  Component Value Date   WBC 8.4 12/18/2012   HGB 12.7* 12/18/2012   HCT 36.0* 12/18/2012   MCV 92.5 12/18/2012   PLT 174 12/18/2012   PROTIME: No results found for this basename: LABPROT, INR,  in the last 72 hours Chemistry No results found for this basename: NA, K, CL, CO2, BUN, CREATININE, CALCIUM, LABALBU, PROT, BILITOT, ALKPHOS, ALT,  AST, GLUCOSE,  in the last 168 hours Lipids No results found for this basename: CHOL, HDL, LDLCALC, TRIG   BNP No results found for this basename: probnp   Miscellaneous No results found for this basename: DDIMER    Radiology/Studies:  No results found.  EKG:  atrial fibrillation  With some ventricular pacing   Assessment and Plan:   Sherryl Manges

## 2013-02-13 NOTE — Telephone Encounter (Signed)
I spoke with the patient's son, Onalee Hua. He is aware that Dr. Kirke Corin and Dr. Graciela Husbands have both reviewed records from [facility] and are in favor of the patient being placed on anticoagulation. Appointment scheduled with Dr. Kirke Corin for 11/6 to discuss with the patient and his son.

## 2013-02-13 NOTE — Assessment & Plan Note (Signed)
Permanent  With rate control  Reasonable. Not on anticoagulation but is asa  Perhaps 2/2 subdural hematoma, although data suggest that anticoagulationcan be resumed if not intracranial bleeding  We will seek to obtain records. He will followup with Dr. Marcheta Grammes in a month or so.

## 2013-02-21 ENCOUNTER — Emergency Department: Payer: Self-pay | Admitting: Emergency Medicine

## 2013-02-21 LAB — URINALYSIS, COMPLETE
Bilirubin,UR: NEGATIVE
Glucose,UR: NEGATIVE mg/dL (ref 0–75)
Granular Cast: 23
Ketone: NEGATIVE
Nitrite: NEGATIVE
Specific Gravity: 1.022 (ref 1.003–1.030)
WBC UR: 2262 /HPF (ref 0–5)

## 2013-02-21 LAB — COMPREHENSIVE METABOLIC PANEL
Alkaline Phosphatase: 110 U/L (ref 50–136)
BUN: 24 mg/dL — ABNORMAL HIGH (ref 7–18)
Chloride: 107 mmol/L (ref 98–107)
EGFR (African American): 44 — ABNORMAL LOW
Glucose: 79 mg/dL (ref 65–99)
Osmolality: 281 (ref 275–301)
SGPT (ALT): 16 U/L (ref 12–78)
Sodium: 139 mmol/L (ref 136–145)

## 2013-02-21 LAB — CBC
HGB: 14.5 g/dL (ref 13.0–18.0)
MCH: 32.4 pg (ref 26.0–34.0)
MCHC: 33.8 g/dL (ref 32.0–36.0)
MCV: 96 fL (ref 80–100)
Platelet: 193 10*3/uL (ref 150–440)
RDW: 14.9 % — ABNORMAL HIGH (ref 11.5–14.5)
WBC: 9.2 10*3/uL (ref 3.8–10.6)

## 2013-02-27 ENCOUNTER — Ambulatory Visit (INDEPENDENT_AMBULATORY_CARE_PROVIDER_SITE_OTHER): Payer: Medicare Other | Admitting: Cardiovascular Disease

## 2013-02-27 ENCOUNTER — Encounter (INDEPENDENT_AMBULATORY_CARE_PROVIDER_SITE_OTHER): Payer: Self-pay

## 2013-02-27 ENCOUNTER — Encounter: Payer: Self-pay | Admitting: Cardiovascular Disease

## 2013-02-27 VITALS — BP 128/70 | HR 68 | Ht 72.0 in | Wt 151.0 lb

## 2013-02-27 DIAGNOSIS — I2510 Atherosclerotic heart disease of native coronary artery without angina pectoris: Secondary | ICD-10-CM

## 2013-02-27 DIAGNOSIS — I4891 Unspecified atrial fibrillation: Secondary | ICD-10-CM

## 2013-02-27 MED ORDER — RIVAROXABAN 20 MG PO TABS: 20.0000 mg | ORAL_TABLET | Freq: Every day | ORAL | Status: DC

## 2013-02-27 NOTE — Patient Instructions (Signed)
Stop Aspirin.  Start Xarelto 20 mg once daily with supper  Follow up in 3 months.
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
## 2013-02-27 NOTE — Assessment & Plan Note (Signed)
No symptoms of chest pain. Continue medical therapy.
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
## 2013-03-12 ENCOUNTER — Ambulatory Visit: Payer: Self-pay | Admitting: Urology

## 2013-03-13 ENCOUNTER — Encounter: Payer: Self-pay | Admitting: Family Medicine

## 2013-03-13 ENCOUNTER — Ambulatory Visit (INDEPENDENT_AMBULATORY_CARE_PROVIDER_SITE_OTHER): Payer: Medicare Other | Admitting: Family Medicine

## 2013-03-13 VITALS — BP 104/68 | HR 80 | Temp 97.5°F | Wt 148.5 lb

## 2013-03-13 DIAGNOSIS — J449 Chronic obstructive pulmonary disease, unspecified: Secondary | ICD-10-CM

## 2013-03-13 DIAGNOSIS — M549 Dorsalgia, unspecified: Secondary | ICD-10-CM | POA: Insufficient documentation

## 2013-03-13 MED ORDER — ALBUTEROL SULFATE (2.5 MG/3ML) 0.083% IN NEBU
2.5000 mg | INHALATION_SOLUTION | Freq: Once | RESPIRATORY_TRACT | Status: AC
  Administered 2013-03-13: 2.5 mg via RESPIRATORY_TRACT

## 2013-03-13 NOTE — Assessment & Plan Note (Addendum)
With changes noted recently, more SOB in the last 2 days.  Initially had some tachypnea on initial exam, then improved with SABA.  He has no focal dec in air movement and no wheeze on exam.  Already on levaquin per family report.  Would try to avoid steroids with no wheeze on exam pre-neb and good response to SABA.  Discussed smoking less. Continue SABA at home prn.  Son and patient agree.  I'll await records on back pain.

## 2013-03-13 NOTE — Assessment & Plan Note (Signed)
No acute changes, will await outside records.  Would be reasonable to try tylenol in the meantime.

## 2013-03-13 NOTE — Patient Instructions (Signed)
I would use the inhaler as needed for the next few days.  If worsening then let us know.  I would finish the antibiotics in the meantime.  Take care.

## 2013-03-13 NOTE — Progress Notes (Signed)
Pre-visit discussion using our clinic review tool. No additional management support is needed unless otherwise documented below in the visit note.  Had CT and u/s of unrecalled variety yesterday for back pain in [place], I don't have records to review.  No meds in the meantime other than aleve last night and that didn't seem to help much.  We agreed to table the back pain as this was already in the midst of w/u elsewhere. He is reportedly already on levaquin per outside clinic for a possible prostate infection.   More SOB since yesterday.  More wheeze recently.  No sputum but cough noted recently. No fevers known to family.  Using albuterol at baseline.  Pt is poor historian.  Smoker, is cutting back to a few a day, discussed. Here with family today.   Meds, vitals, and allergies reviewed.   ROS: See HPI.  Otherwise, noncontributory.  nad but slightly SOB on initial eval Mmm rrr Ctab, no wheeze, slight tachypnea initially  abd soft Ext w/o edema Skin with normal cap refill Bruising noted on dorsum on R hand.  In back brace as usual  ctab and no tachypnea after SABA neb, pulse ox 97% and felt much better.

## 2013-03-14 ENCOUNTER — Emergency Department: Payer: Self-pay | Admitting: Emergency Medicine

## 2013-03-14 LAB — COMPREHENSIVE METABOLIC PANEL
Albumin: 3.4 g/dL (ref 3.4–5.0)
Alkaline Phosphatase: 103 U/L (ref 50–136)
BUN: 18 mg/dL (ref 7–18)
Bilirubin,Total: 0.6 mg/dL (ref 0.2–1.0)
Calcium, Total: 8.5 mg/dL (ref 8.5–10.1)
Chloride: 107 mmol/L (ref 98–107)
Co2: 28 mmol/L (ref 21–32)
Creatinine: 1.26 mg/dL (ref 0.60–1.30)
EGFR (African American): 59 — ABNORMAL LOW
EGFR (Non-African Amer.): 51 — ABNORMAL LOW
Glucose: 104 mg/dL — ABNORMAL HIGH (ref 65–99)
Potassium: 3.8 mmol/L (ref 3.5–5.1)
SGOT(AST): 22 U/L (ref 15–37)
SGPT (ALT): 17 U/L (ref 12–78)
Sodium: 138 mmol/L (ref 136–145)
Total Protein: 6.3 g/dL — ABNORMAL LOW (ref 6.4–8.2)

## 2013-03-14 LAB — CBC
HCT: 41.1 % (ref 40.0–52.0)
HGB: 14.1 g/dL (ref 13.0–18.0)
MCHC: 34.2 g/dL (ref 32.0–36.0)
MCV: 97 fL (ref 80–100)
Platelet: 206 10*3/uL (ref 150–440)
RBC: 4.25 10*6/uL — ABNORMAL LOW (ref 4.40–5.90)
RDW: 14.7 % — ABNORMAL HIGH (ref 11.5–14.5)
WBC: 8.2 10*3/uL (ref 3.8–10.6)

## 2013-03-14 LAB — URINALYSIS, COMPLETE
Bilirubin,UR: NEGATIVE
Glucose,UR: NEGATIVE mg/dL (ref 0–75)
Ketone: NEGATIVE
Nitrite: NEGATIVE
Ph: 5 (ref 4.5–8.0)
Protein: 100
RBC,UR: 2885 /HPF (ref 0–5)
Squamous Epithelial: 3
WBC UR: 1123 /HPF (ref 0–5)

## 2013-04-02 ENCOUNTER — Encounter: Payer: Self-pay | Admitting: Family Medicine

## 2013-04-02 ENCOUNTER — Ambulatory Visit (INDEPENDENT_AMBULATORY_CARE_PROVIDER_SITE_OTHER): Payer: Medicare Other | Admitting: Family Medicine

## 2013-04-02 VITALS — BP 104/64 | HR 70 | Temp 98.0°F | Resp 28 | Wt 150.5 lb

## 2013-04-02 DIAGNOSIS — J449 Chronic obstructive pulmonary disease, unspecified: Secondary | ICD-10-CM

## 2013-04-02 MED ORDER — PREDNISONE 10 MG PO TABS: ORAL_TABLET | ORAL | Status: DC

## 2013-04-02 MED ORDER — DOXYCYCLINE HYCLATE 100 MG PO TABS: 100.0000 mg | ORAL_TABLET | Freq: Two times a day (BID) | ORAL | Status: DC

## 2013-04-02 NOTE — Progress Notes (Signed)
Pre-visit discussion using our clinic review tool. No additional management support is needed unless otherwise documented below in the visit note.  Recheck pulse ox 97%- his hands were cold initially.   More cough and wheeze in last ~2 days.  Using SABA with some relief.  More sputum.  Done with abx recently.  Delsym helps some.  No fevers.  Still eating at baseline.  Smoker.   Meds, vitals, and allergies reviewed.   ROS: See HPI.  Otherwise, noncontributory.  Nad, dementia at baseline. Speech at baseline Tm wnl OP wnl Neck supple  IRR, not tachy Diffuse rhonchi but no inc in wob. Scant wheeze noted abd soft Ext w/o edema

## 2013-04-02 NOTE — Patient Instructions (Signed)
Prednisone with food, start the doxy today.  Stop smoking.  Call back as needed.  Rest and fluids in the meantime.  Take care.

## 2013-04-03 NOTE — Assessment & Plan Note (Signed)
Exacerbation, start pred with GI caution.  Avoid other Nsaids for now.  Discussed.  Start doxy, continue SABA.  Advised to stop smoking.  Son is trying to help with that.  Okay for outpatient f/u.  Nontoxic.  Call back prn.

## 2013-04-14 ENCOUNTER — Inpatient Hospital Stay: Payer: Self-pay | Admitting: Internal Medicine

## 2013-04-14 DIAGNOSIS — I495 Sick sinus syndrome: Secondary | ICD-10-CM

## 2013-04-14 DIAGNOSIS — F0390 Unspecified dementia without behavioral disturbance: Secondary | ICD-10-CM

## 2013-04-14 DIAGNOSIS — I2510 Atherosclerotic heart disease of native coronary artery without angina pectoris: Secondary | ICD-10-CM

## 2013-04-14 DIAGNOSIS — E876 Hypokalemia: Secondary | ICD-10-CM

## 2013-04-14 DIAGNOSIS — I4891 Unspecified atrial fibrillation: Secondary | ICD-10-CM

## 2013-04-14 LAB — URINALYSIS, COMPLETE
Bilirubin,UR: NEGATIVE
Leukocyte Esterase: NEGATIVE
Nitrite: NEGATIVE
Protein: 100
Squamous Epithelial: 3
WBC UR: 367 /HPF (ref 0–5)

## 2013-04-14 LAB — CBC
HCT: 39.9 % — ABNORMAL LOW (ref 40.0–52.0)
MCH: 31.2 pg (ref 26.0–34.0)
Platelet: 219 10*3/uL (ref 150–440)
RBC: 4.25 10*6/uL — ABNORMAL LOW (ref 4.40–5.90)
RDW: 14.9 % — ABNORMAL HIGH (ref 11.5–14.5)
WBC: 8.7 10*3/uL (ref 3.8–10.6)

## 2013-04-14 LAB — COMPREHENSIVE METABOLIC PANEL
Albumin: 3.1 g/dL — ABNORMAL LOW (ref 3.4–5.0)
BUN: 16 mg/dL (ref 7–18)
Bilirubin,Total: 0.8 mg/dL (ref 0.2–1.0)
Calcium, Total: 8.3 mg/dL — ABNORMAL LOW (ref 8.5–10.1)
EGFR (Non-African Amer.): 55 — ABNORMAL LOW
Osmolality: 283 (ref 275–301)
SGOT(AST): 24 U/L (ref 15–37)
Total Protein: 5.6 g/dL — ABNORMAL LOW (ref 6.4–8.2)

## 2013-04-14 LAB — PROTIME-INR: INR: 1.9

## 2013-04-15 LAB — PROTIME-INR
INR: 1.7
Prothrombin Time: 19.6 secs — ABNORMAL HIGH (ref 11.5–14.7)

## 2013-04-15 LAB — CBC WITH DIFFERENTIAL/PLATELET
Basophil #: 0 10*3/uL (ref 0.0–0.1)
Basophil %: 0.1 %
Eosinophil #: 0 10*3/uL (ref 0.0–0.7)
Eosinophil %: 0.4 %
HGB: 11.7 g/dL — ABNORMAL LOW (ref 13.0–18.0)
Lymphocyte #: 1 10*3/uL (ref 1.0–3.6)
MCHC: 34.4 g/dL (ref 32.0–36.0)
MCV: 95 fL (ref 80–100)
Monocyte #: 1.1 x10 3/mm — ABNORMAL HIGH (ref 0.2–1.0)
Monocyte %: 12.2 %
Neutrophil %: 76.6 %
Platelet: 184 10*3/uL (ref 150–440)
RDW: 14.8 % — ABNORMAL HIGH (ref 11.5–14.5)
WBC: 9.3 10*3/uL (ref 3.8–10.6)

## 2013-04-15 LAB — BASIC METABOLIC PANEL
BUN: 14 mg/dL (ref 7–18)
Calcium, Total: 8.3 mg/dL — ABNORMAL LOW (ref 8.5–10.1)
Co2: 31 mmol/L (ref 21–32)
Creatinine: 0.97 mg/dL (ref 0.60–1.30)
EGFR (African American): >60
Osmolality: 277 (ref 275–301)
Sodium: 138 mmol/L (ref 136–145)

## 2013-04-16 LAB — BASIC METABOLIC PANEL
Anion Gap: 3 — ABNORMAL LOW (ref 7–16)
BUN: 12 mg/dL (ref 7–18)
Calcium, Total: 8 mg/dL — ABNORMAL LOW (ref 8.5–10.1)
Chloride: 103 mmol/L (ref 98–107)
Co2: 30 mmol/L (ref 21–32)
EGFR (African American): >60
Glucose: 81 mg/dL (ref 65–99)
Osmolality: 271 (ref 275–301)
Potassium: 3.6 mmol/L (ref 3.5–5.1)
Sodium: 136 mmol/L (ref 136–145)

## 2013-04-16 LAB — CBC WITH DIFFERENTIAL/PLATELET
Basophil #: 0 10*3/uL (ref 0.0–0.1)
Basophil %: 0.2 %
Eosinophil #: 0.1 10*3/uL (ref 0.0–0.7)
Lymphocyte #: 1 10*3/uL (ref 1.0–3.6)
MCH: 31.7 pg (ref 26.0–34.0)
MCV: 94 fL (ref 80–100)
Neutrophil #: 7.3 10*3/uL — ABNORMAL HIGH (ref 1.4–6.5)
Neutrophil %: 76.4 %
RBC: 3.47 10*6/uL — ABNORMAL LOW (ref 4.40–5.90)
WBC: 9.6 10*3/uL (ref 3.8–10.6)

## 2013-04-16 LAB — PROTIME-INR
INR: 1.3
Prothrombin Time: 15.8 secs — ABNORMAL HIGH (ref 11.5–14.7)

## 2013-04-17 LAB — PROTIME-INR
INR: 1.3
Prothrombin Time: 16.1 secs — ABNORMAL HIGH (ref 11.5–14.7)

## 2013-04-18 LAB — CBC WITH DIFFERENTIAL/PLATELET
Basophil #: 0 10*3/uL (ref 0.0–0.1)
Basophil %: 0.6 %
Eosinophil #: 0.1 10*3/uL (ref 0.0–0.7)
Eosinophil %: 1.1 %
HCT: 33 % — ABNORMAL LOW (ref 40.0–52.0)
HGB: 11.6 g/dL — ABNORMAL LOW (ref 13.0–18.0)
Lymphocyte #: 0.9 10*3/uL — ABNORMAL LOW (ref 1.0–3.6)
Lymphocyte %: 11.6 %
MCHC: 35 g/dL (ref 32.0–36.0)
MCV: 93 fL (ref 80–100)
Monocyte #: 0.8 x10 3/mm (ref 0.2–1.0)
Monocyte %: 10.2 %
Neutrophil %: 76.5 %
Platelet: 158 10*3/uL (ref 150–440)
RBC: 3.54 10*6/uL — ABNORMAL LOW (ref 4.40–5.90)
RDW: 14.3 % (ref 11.5–14.5)
WBC: 7.6 10*3/uL (ref 3.8–10.6)

## 2013-04-18 LAB — BASIC METABOLIC PANEL
Anion Gap: 3 — ABNORMAL LOW (ref 7–16)
Co2: 31 mmol/L (ref 21–32)
Creatinine: 1.01 mg/dL (ref 0.60–1.30)
Osmolality: 273 (ref 275–301)
Potassium: 3.6 mmol/L (ref 3.5–5.1)
Sodium: 137 mmol/L (ref 136–145)

## 2013-04-18 LAB — PROTIME-INR: INR: 1.2

## 2013-04-19 LAB — BASIC METABOLIC PANEL
Anion Gap: 4 — ABNORMAL LOW (ref 7–16)
BUN: 13 mg/dL (ref 7–18)
Calcium, Total: 8 mg/dL — ABNORMAL LOW (ref 8.5–10.1)
Creatinine: 1.12 mg/dL (ref 0.60–1.30)
EGFR (African American): >60
EGFR (Non-African Amer.): 58 — ABNORMAL LOW
Glucose: 109 mg/dL — ABNORMAL HIGH (ref 65–99)
Potassium: 3.7 mmol/L (ref 3.5–5.1)
Sodium: 138 mmol/L (ref 136–145)

## 2013-04-19 LAB — HEMOGLOBIN: HGB: 9.8 g/dL — ABNORMAL LOW (ref 13.0–18.0)

## 2013-04-20 LAB — BASIC METABOLIC PANEL
Anion Gap: 7 (ref 7–16)
Calcium, Total: 8.2 mg/dL — ABNORMAL LOW (ref 8.5–10.1)
Chloride: 104 mmol/L (ref 98–107)
Creatinine: 1.19 mg/dL (ref 0.60–1.30)
EGFR (African American): >60
EGFR (Non-African Amer.): 54 — ABNORMAL LOW
Glucose: 80 mg/dL (ref 65–99)
Osmolality: 275 (ref 275–301)
Potassium: 3.5 mmol/L (ref 3.5–5.1)
Sodium: 138 mmol/L (ref 136–145)

## 2013-04-22 ENCOUNTER — Encounter: Payer: Self-pay | Admitting: Adult Health

## 2013-04-22 ENCOUNTER — Non-Acute Institutional Stay (SKILLED_NURSING_FACILITY): Payer: Medicare Other | Admitting: Adult Health

## 2013-04-22 DIAGNOSIS — K5900 Constipation, unspecified: Secondary | ICD-10-CM

## 2013-04-22 DIAGNOSIS — I4891 Unspecified atrial fibrillation: Secondary | ICD-10-CM

## 2013-04-22 DIAGNOSIS — S7291XD Unspecified fracture of right femur, subsequent encounter for closed fracture with routine healing: Secondary | ICD-10-CM

## 2013-04-22 DIAGNOSIS — K219 Gastro-esophageal reflux disease without esophagitis: Secondary | ICD-10-CM

## 2013-04-22 DIAGNOSIS — S7291XA Unspecified fracture of right femur, initial encounter for closed fracture: Secondary | ICD-10-CM | POA: Insufficient documentation

## 2013-04-22 DIAGNOSIS — S7290XD Unspecified fracture of unspecified femur, subsequent encounter for closed fracture with routine healing: Secondary | ICD-10-CM

## 2013-04-22 DIAGNOSIS — N400 Enlarged prostate without lower urinary tract symptoms: Secondary | ICD-10-CM

## 2013-04-22 DIAGNOSIS — J449 Chronic obstructive pulmonary disease, unspecified: Secondary | ICD-10-CM

## 2013-04-22 DIAGNOSIS — I10 Essential (primary) hypertension: Secondary | ICD-10-CM

## 2013-04-22 DIAGNOSIS — F0390 Unspecified dementia without behavioral disturbance: Secondary | ICD-10-CM

## 2013-04-22 MED ORDER — MEMANTINE HCL ER 7 MG PO CP24: 7.0000 mg | ORAL_CAPSULE | Freq: Every day | ORAL | Status: DC

## 2013-04-22 MED ORDER — ALBUTEROL SULFATE (2.5 MG/3ML) 0.083% IN NEBU: 2.5000 mg | INHALATION_SOLUTION | Freq: Four times a day (QID) | RESPIRATORY_TRACT | Status: DC | PRN

## 2013-04-22 NOTE — Progress Notes (Signed)
Patient ID: Matthew Floyd, male   DOB: 10/13/1923, 79 y.o.   MRN: [id number]     [facility]  Allergies  Allergen Reactions  . Codeine   . Flecainide     H/o CAD  . Morphine And Related   . Multaq [Dronedarone]   . Penicillins      Chief Complaint  Patient presents with  . Hospitalization Follow-up    HPI:  He was hospitalized following a fall home with a right femur fracture requiring surgical intervention. He is here for short term rehab; with his goal to return back home with his family. He is unable to participate in the hpi or ros due to his dementia.   His family is concerned about him being too "withdrawn".  We discussed his medications.  He is taking oxycodone for pain; this could be one of the issues causing him to be lethargic. He is also taking aricept; but has a low bmi; he would most liekly be better served taking namenda. He was taking albuterol as needed for shortness of breath.   Past Medical History  Diagnosis Date  . A-fib   . Pacemaker -Biotronik   . CHF (congestive heart failure)     EF 50% on echo 2013, 44% on nuclear stufy 07/2011  . Anemia, pernicious   . Arthritis   . Back pain   . Cerebral aneurysm   . COPD (chronic obstructive pulmonary disease)   . DDD (degenerative disc disease)   . GERD (gastroesophageal reflux disease)     h/o peptic ulcer  . HTN (hypertension)   . Subdural hematoma   . History of skin cancer   . Cigarette smoker   . Valvular disease     MR/TR on echo prev  . Dementia   . MI (myocardial infarction)   . BPH (benign prostatic hyperplasia)   . CAD (coronary artery disease)     s/p stent placement  . Enlarged prostate   . Dementia   . Fall at home     Past Surgical History  Procedure Laterality Date  . Finger amputation      traumatic  . Angioplasty    . Hernia repair    . Burr hole for subdural hematoma      with evacuation  . Pacemaker placement      biotronik 10/12/09  . Partial gastrectomy    .  Cholecystectomy    . Cardiac catheterization    . Coronary angioplasty    . Insert / replace / remove pacemaker    . Orif right hip     Filed Vitals:   04/22/13 1213  BP: 107/67  Pulse: 68  Height: 6\' 2"  (1.88 m)  Weight: 151 lb 3.2 oz (68.584 kg)      Patient's Medications  New Prescriptions   No medications on file  Previous Medications   DIGOXIN (LANOXIN) 0.125 MG TABLET    Take 1 tablet (0.125 mg total) by mouth daily.   DILTIAZEM (CARDIZEM CD) 120 MG 24 HR CAPSULE    Take 1 capsule (120 mg total) by mouth daily.   DONEPEZIL (ARICEPT) 5 MG TABLET    Take 1 tablet (5 mg total) by mouth at bedtime.   METOPROLOL SUCCINATE (TOPROL-XL) 25 MG 24 HR TABLET    Take 0.5 tablets (12.5 mg total) by mouth 2 (two) times daily.   OXYCODONE (OXY IR/ROXICODONE) 5 MG IMMEDIATE RELEASE TABLET    Take 5 mg by mouth every 4 (four) hours as needed  for severe pain.   RANITIDINE (ZANTAC) 150 MG TABLET    Take 1 tablet (150 mg total) by mouth daily.   SENNOSIDES-DOCUSATE SODIUM (SENOKOT-S) 8.6-50 MG TABLET    Take 1 tablet by mouth 2 (two) times daily.   TAMSULOSIN (FLOMAX) 0.4 MG CAPS CAPSULE    Take 1 capsule (0.4 mg total) by mouth daily.   TRAMADOL (ULTRAM) 50 MG TABLET    Take 50 mg by mouth every 4 (four) hours as needed.  Modified Medications   Modified Medication Previous Medication   RIVAROXABAN (XARELTO) 20 MG TABS TABLET Rivaroxaban (XARELTO) 20 MG TABS tablet      Take 15 mg by mouth daily with supper.    Take 1 tablet (20 mg total) by mouth daily with supper.  Discontinued Medications   ALBUTEROL (PROVENTIL HFA;VENTOLIN HFA) 108 (90 BASE) MCG/ACT INHALER    Inhale 2 puffs into the lungs every 6 (six) hours as needed for wheezing.   CHOLECALCIFEROL (D3-1000) 1000 UNITS CAPSULE    Take 1,000 Units by mouth daily.   DEXTROMETHORPHAN-MENTHOL (DELSYM COUGH RELIEF MT)    Use as directed in the mouth or throat as needed.   DOCUSATE SODIUM (COLACE) 100 MG CAPSULE    Take 100 mg by mouth 2 (two)  times daily.   DOXYCYCLINE (VIBRA-TABS) 100 MG TABLET    Take 1 tablet (100 mg total) by mouth 2 (two) times daily.   MULTIPLE VITAMIN (MULTIVITAMIN) CAPSULE    Take 1 capsule by mouth daily.   NAPROXEN SODIUM (ANAPROX) 220 MG TABLET    Take 220 mg by mouth 2 (two) times daily as needed (with a meal).   NITROGLYCERIN (NITROSTAT) 0.4 MG SL TABLET    Place 0.4 mg under the tongue every 5 (five) minutes as needed for chest pain.   PREDNISONE (DELTASONE) 10 MG TABLET    3 pills a day for 3 days, then 2 pills a day for 3 days, then 1 pill a day for 3 days. With food.   PROBIOTIC PRODUCT ([employer] DIGESTIVE PROBIOTIC PO)    Take 1 capsule by mouth daily.      SIGNIFICANT DIAGNOSTIC EXAMS  04-14-13: chest x-ray: no active disease; again noted hyperinflation and chronic mild interstitial prominence  04-14-13: right hip x-ray: minimally displaced intertrochanteric right femur fracture  04-18-13: right femur x-ray: a medullary rod and compression screw are now seen. The fracture fragments of the proximal right femur are in anatomic alignment. No acute abnormality seen.     LABS REVIEWED:   04-14-13: wbc 8.7; hgb 13.3; hct 39.9; mcv 94; plt 219; glucose 133; bun 16; creat 1.17; k+2.9; na++140; liver normal albumin 3.1  04-16-13: wbc 9.6; hgb 11.0; hct 32.7; mcv 94 ;plt 145 glucose 81; bun 12; creat 0.84; k+3.6; na++136  04-18-13: wbc 7.6; hgb 11.6; hct 33.0; mcv 93; plt 158; glucose 98; bun 10; creat 1.01; k+3.6;na++ 137    Review of Systems  Unable to perform ROS   Physical Exam  Constitutional: No distress.  frail  Neck: Neck supple. No JVD present.  Cardiovascular: Normal rate and intact distal pulses.   Heart rate irregular   Respiratory: Effort normal and breath sounds normal. No respiratory distress.  02 dependent  GI: Soft. Bowel sounds are normal. He exhibits no distension. There is no tenderness.  Musculoskeletal: He exhibits no edema.  Has limited range of motion to the right  lower extremity; is able to move all other extremities   Neurological: He is alert.  Skin:  Skin is warm and dry. He is not diaphoretic.  Incision line without signs of infection present; has moderate amount of serous drainage present.       ASSESSMENT/ PLAN:  1. Right femur fracture; will continue therapy as directed; will stop his oxycodone. Will continue ultram 50 mg every 4 hours as needed for pain. Will continue to monitor his status.   2. Dementia: he is not as involved in his environment. Will stop the aricept and will start namenda xr 7 mg daily and will monitor  3. Copd: will continue his 02 at this time. He was not using 02 at home. Will start albuterol neb every 6 hours as needed and will monitor  4. Afib: is stable will continue xarelto 15 mg daily; digoxin .125 mg daily for rate control and will monitor  5. Hypertension: is stable will continue toprol xl 12.5 mg twice daily and cardizem cd 120 mg daily and will monitor his status.   6. Genella Rife: will continue zantac 150 mg twice daily  7. Bph: will continue flomax 0.4 mg daily  8. Constipation: will continue senna s twice daily   Will check cbc and bmp next week  Time spent with patient 50 minutes.

## 2013-04-28 ENCOUNTER — Non-Acute Institutional Stay (SKILLED_NURSING_FACILITY): Payer: Medicare Other | Admitting: Internal Medicine

## 2013-04-28 ENCOUNTER — Encounter: Payer: Self-pay | Admitting: Internal Medicine

## 2013-04-28 DIAGNOSIS — IMO0002 Reserved for concepts with insufficient information to code with codable children: Secondary | ICD-10-CM

## 2013-04-28 DIAGNOSIS — J449 Chronic obstructive pulmonary disease, unspecified: Secondary | ICD-10-CM

## 2013-04-28 DIAGNOSIS — I4891 Unspecified atrial fibrillation: Secondary | ICD-10-CM

## 2013-04-28 DIAGNOSIS — R4182 Altered mental status, unspecified: Secondary | ICD-10-CM

## 2013-04-28 DIAGNOSIS — S7291XP Unspecified fracture of right femur, subsequent encounter for closed fracture with malunion: Secondary | ICD-10-CM

## 2013-04-28 DIAGNOSIS — N400 Enlarged prostate without lower urinary tract symptoms: Secondary | ICD-10-CM

## 2013-04-28 NOTE — Progress Notes (Signed)
Patient ID: Matthew RavelFred Floyd, male   DOB: Jul 10, 1923, 80 y.o.   MRN: [id number]     Matthew Floyd    PCP: Matthew GivensGraham Duncan, Matthew Floyd  Code Status: full code  Allergies  Allergen Reactions  . Codeine   . Flecainide     H/o CAD  . Morphine And Related   . Multaq [Dronedarone]   . Penicillins     Chief Complaint: new admit  HPI:  80 y/o male patient is here for STR after hospital admission from 04/14/13- 04/21/13 with a fall and right hip fracture. He underwent surgical repair. He has dementia at baseline. He has been working with therapy team. As per staff, his pain is not under control. History taking from patient is limited due to his memory loss. Also as per his son, he has memory loss but appears more confused after being in the facility. U/a with culture is pending. No other concerns. No falls reported. Po intake is fair.  Review of Systems:  Denies chest pain or dyspnea Denies fever or chills Denies nausea or vomiting No bowel or new bladder complaint Limited ROS from patient  Past Medical History  Diagnosis Date  . A-fib   . Pacemaker -Biotronik   . CHF (congestive heart failure)     EF 50% on echo 2013, 44% on nuclear stufy 07/2011  . Anemia, pernicious   . Arthritis   . Back pain   . Cerebral aneurysm   . COPD (chronic obstructive pulmonary disease)   . DDD (degenerative disc disease)   . GERD (gastroesophageal reflux disease)     h/o peptic ulcer  . HTN (hypertension)   . Subdural hematoma   . History of skin cancer   . Cigarette smoker   . Valvular disease     MR/TR on echo prev  . Dementia   . MI (myocardial infarction)   . BPH (benign prostatic hyperplasia)   . CAD (coronary artery disease)     s/p stent placement  . Enlarged prostate   . Dementia   . Fall at home    Past Surgical History  Procedure Laterality Date  . Finger amputation      traumatic  . Angioplasty    . Hernia repair    . Burr hole for subdural hematoma      with evacuation    . Pacemaker placement      biotronik 10/12/09  . Partial gastrectomy    . Cholecystectomy    . Cardiac catheterization    . Coronary angioplasty    . Insert / replace / remove pacemaker    . Orif right hip     Social History:   reports that he has been smoking Cigarettes.  He started smoking about 61 years ago. He has a 32.5 pack-year smoking history. He has never used smokeless tobacco. He reports that he does not drink alcohol or use illicit drugs.  Family History  Problem Relation Age of Onset  . Dementia Mother   . Heart disease Father   . Heart disease Brother     Medications: Patient's Medications  New Prescriptions   No medications on file  Previous Medications   ALBUTEROL (PROVENTIL) (2.5 MG/3ML) 0.083% NEBULIZER SOLUTION    Take 3 mLs (2.5 mg total) by nebulization every 6 (six) hours as needed for wheezing or shortness of breath.   DIGOXIN (LANOXIN) 0.125 MG TABLET    Take 1 tablet (0.125 mg total) by mouth daily.   DILTIAZEM (CARDIZEM  CD) 120 MG 24 HR CAPSULE    Take 1 capsule (120 mg total) by mouth daily.   MEMANTINE HCL ER (NAMENDA XR) 7 MG CP24    Take 1 capsule (7 mg total) by mouth daily.   METOPROLOL SUCCINATE (TOPROL-XL) 25 MG 24 HR TABLET    Take 0.5 tablets (12.5 mg total) by mouth 2 (two) times daily.   RANITIDINE (ZANTAC) 150 MG TABLET    Take 1 tablet (150 mg total) by mouth daily.   RIVAROXABAN (XARELTO) 20 MG TABS TABLET    Take 15 mg by mouth daily with supper.   SENNOSIDES-DOCUSATE SODIUM (SENOKOT-S) 8.6-50 MG TABLET    Take 1 tablet by mouth 2 (two) times daily.   TAMSULOSIN (FLOMAX) 0.4 MG CAPS CAPSULE    Take 1 capsule (0.4 mg total) by mouth daily.   TRAMADOL (ULTRAM) 50 MG TABLET    Take 50 mg by mouth every 4 (four) hours as needed.  Modified Medications   No medications on file  Discontinued Medications   No medications on file     Physical Exam: Filed Vitals:   04/28/13 1350  BP: 109/65  Pulse: 75  Temp: 98.5 F (36.9 C)  Resp: 18   SpO2: 96%   Constitutional: No distress. Elderly and frail  Neck: Neck supple. No JVD present.  Cardiovascular: Normal rate and intact distal pulses. Heart rate irregular   Respiratory: Effort normal and breath sounds normal. No respiratory distress. Continuous 02 dependent  GI: Soft. Bowel sounds are normal. He exhibits no distension. There is no tenderness.  Musculoskeletal: He exhibits no edema. Has limited range of motion to the right lower extremity. Right leg has boot. Neurological: He is alert and oriented to person only Skin: Skin is warm and dry. He is not diaphoretic. Incision line without signs of infection present   Labs reviewed: Basic Metabolic Panel:  Recent Labs  96/04/54 0406  NA 138  K 3.8  CL 104  CO2 25  GLUCOSE 106*  BUN 18  CREATININE 1.06  CALCIUM 8.5   CBC:  Recent Labs  12/18/12 0406  WBC 8.4  NEUTROABS 6.1  HGB 12.7*  HCT 36.0*  MCV 92.5  PLT 174   Cardiac Enzymes:  Recent Labs  12/18/12 0407  TROPONINI <0.30   04-14-13: wbc 8.7; hgb 13.3; hct 39.9; mcv 94; plt 219; glucose 133; bun 16; creat 1.17; k+2.9; na++140; liver normal albumin 3.1   04-16-13: wbc 9.6; hgb 11.0; hct 32.7; mcv 94 ;plt 145 glucose 81; bun 12; creat 0.84; k+3.6; na++136   04-18-13: wbc 7.6; hgb 11.6; hct 33.0; mcv 93; plt 158; glucose 98; bun 10; creat 1.01; k+3.6;na++ 137  SIGNIFICANT DIAGNOSTIC EXAMS  04-14-13: chest x-ray: no active disease; again noted hyperinflation and chronic mild interstitial prominence  04-14-13: right hip x-ray: minimally displaced intertrochanteric right femur fracture  04-18-13: right femur x-ray: a medullary rod and compression screw are now seen. The fracture fragments of the proximal right femur are in anatomic alignment. No acute abnormality seen.    Assessment/Plan  Altered mental status- his dementia is the main contributing factor here. Remains afebrile. Will rule out UTI- u/a with culture pending. Continue namenda xr for  now. He is off aricept and oxycodone. Check cbc with diff and bmp  Right heel unstageable ulcer- continue wearing heel floaters, pressure ulcer prophylaxis, continue wound care. Continue zinc and vitamin c supplement  Right hip fracture- s/p surgical repair, dressing change and skin care for incision site. Continue  with PT and OT, fall precautions. Will continue xarelto for dvt prophylaxis and secondary stroke prophylaxis in setting of afib. Will change tramadol to 50 mg 1-2 tab q6h prn pain. Continue bowel regimen  afib- rate currently controlled. Continue digoxin and metoprolol with diltiazem. Monitor for bleed in setting of use of xarelto. After completing therapy, consider switch to aspirin from xarelto prior to discharge with risk of falls  BPH- symptom controlled with flomax, continue this  Copd- continue o2 by nasal canula to keep o2 sat above 92% at rest and his duonebs prn   Family/ staff Communication: reviewed care plan with patient and nursing supervisor  Goals of care: to return home   Labs/tests ordered: cbc, bmp

## 2013-04-29 ENCOUNTER — Other Ambulatory Visit: Payer: Self-pay | Admitting: *Deleted

## 2013-04-29 MED ORDER — TRAMADOL HCL 50 MG PO TABS: ORAL_TABLET | ORAL | Status: DC

## 2013-05-12 ENCOUNTER — Encounter: Payer: Self-pay | Admitting: Adult Health

## 2013-05-12 ENCOUNTER — Non-Acute Institutional Stay (SKILLED_NURSING_FACILITY): Payer: Medicare Other | Admitting: Adult Health

## 2013-05-12 DIAGNOSIS — L89610 Pressure ulcer of right heel, unstageable: Secondary | ICD-10-CM | POA: Insufficient documentation

## 2013-05-12 DIAGNOSIS — L8995 Pressure ulcer of unspecified site, unstageable: Secondary | ICD-10-CM

## 2013-05-12 DIAGNOSIS — L89609 Pressure ulcer of unspecified heel, unspecified stage: Secondary | ICD-10-CM

## 2013-05-12 NOTE — Progress Notes (Signed)
Patient ID: Matthew Floyd, male   DOB: 08/10/1923, 80 y.o.   MRN: [id number]     [facility]  Allergies  Allergen Reactions  . Codeine   . Flecainide     H/o CAD  . Morphine And Related   . Multaq [Dronedarone]   . Penicillins      Chief Complaint  Patient presents with  . Acute Visit    wound management     HPI:  He is being seen for hi right heel pressure ulcer. He was admitted to this facility with this ulceration. There is slough present for which he is presently being chemically debrided with santyl. He may require a mechanical debridement. There are no signs of infection present.    Past Medical History  Diagnosis Date  . A-fib   . Pacemaker -Biotronik   . CHF (congestive heart failure)     EF 50% on echo 2013, 44% on nuclear stufy 07/2011  . Anemia, pernicious   . Arthritis   . Back pain   . Cerebral aneurysm   . COPD (chronic obstructive pulmonary disease)   . DDD (degenerative disc disease)   . GERD (gastroesophageal reflux disease)     h/o peptic ulcer  . HTN (hypertension)   . Subdural hematoma   . History of skin cancer   . Cigarette smoker   . Valvular disease     MR/TR on echo prev  . Dementia   . MI (myocardial infarction)   . BPH (benign prostatic hyperplasia)   . CAD (coronary artery disease)     s/p stent placement  . Enlarged prostate   . Dementia   . Fall at home     Past Surgical History  Procedure Laterality Date  . Finger amputation      traumatic  . Angioplasty    . Hernia repair    . Burr hole for subdural hematoma      with evacuation  . Pacemaker placement      biotronik 10/12/09  . Partial gastrectomy    . Cholecystectomy    . Cardiac catheterization    . Coronary angioplasty    . Insert / replace / remove pacemaker    . Orif right hip      VITAL SIGNS BP 104/65  Pulse 68  Ht 6\' 2"  (1.88 m)  Wt 143 lb 3.2 oz (64.955 kg)  BMI 18.38 kg/m2   Patient's Medications  New Prescriptions   No medications on file    Previous Medications   ALBUTEROL (PROVENTIL) (2.5 MG/3ML) 0.083% NEBULIZER SOLUTION    Take 3 mLs (2.5 mg total) by nebulization every 6 (six) hours as needed for wheezing or shortness of breath.   DIGOXIN (LANOXIN) 0.125 MG TABLET    Take 1 tablet (0.125 mg total) by mouth daily.   DILTIAZEM (CARDIZEM CD) 120 MG 24 HR CAPSULE    Take 1 capsule (120 mg total) by mouth daily.   MEMANTINE HCL ER (NAMENDA XR) 7 MG CP24    Take 1 capsule (7 mg total) by mouth daily.   METOPROLOL SUCCINATE (TOPROL-XL) 25 MG 24 HR TABLET    Take 0.5 tablets (12.5 mg total) by mouth 2 (two) times daily.   RANITIDINE (ZANTAC) 150 MG TABLET    Take 1 tablet (150 mg total) by mouth daily.   RIVAROXABAN (XARELTO) 20 MG TABS TABLET    Take 15 mg by mouth daily with supper.   SENNOSIDES-DOCUSATE SODIUM (SENOKOT-S) 8.6-50 MG TABLET  Take 1 tablet by mouth 2 (two) times daily.   TAMSULOSIN (FLOMAX) 0.4 MG CAPS CAPSULE    Take 1 capsule (0.4 mg total) by mouth daily.   TRAMADOL (ULTRAM) 50 MG TABLET    Take one tablet by mouth every 6 hours as needed for pain 1-5; Take two tablets by mouth every 6 hours as needed for pain 6-10  Modified Medications   No medications on file  Discontinued Medications   No medications on file    SIGNIFICANT DIAGNOSTIC EXAMS   04-14-13: chest x-ray: no active disease; again noted hyperinflation and chronic mild interstitial prominence  04-14-13: right hip x-ray: minimally displaced intertrochanteric right femur fracture  04-18-13: right femur x-ray: a medullary rod and compression screw are now seen. The fracture fragments of the proximal right femur are in anatomic alignment. No acute abnormality seen.   04-28-13: ABI: RIGHT: 0.57; LEFT 0.75     LABS REVIEWED:   04-14-13: wbc 8.7; hgb 13.3; hct 39.9; mcv 94; plt 219; glucose 133; bun 16; creat 1.17; k+2.9; na++140; liver normal albumin 3.1  04-16-13: wbc 9.6; hgb 11.0; hct 32.7; mcv 94 ;plt 145 glucose 81; bun 12; creat 0.84; k+3.6;  na++136  04-18-13: wbc 7.6; hgb 11.6; hct 33.0; mcv 93; plt 158; glucose 98; bun 10; creat 1.01; k+3.6;na++ 137 04-23-13:wbc 7.8; hgb 10.1; hct 30.9; mcv 98.1; plt 308; glucose 126; bun 12;creat 1.2; k+3.8;na++137 04-30-13: wbc 7.3; hgb 11.6; hct 34.0; mcv 95.5; plt 509; glucose 79; bun 14; creat 1.0; k+4.3; na++ 138    Review of Systems  Unable to perform ROS   Physical Exam  Constitutional: No distress.  frail  Neck: Neck supple. No JVD present.  Cardiovascular: Normal rate and intact distal pulses.   Heart rate irregular   Respiratory: Effort normal and breath sounds normal. No respiratory distress.  02 dependent  GI: Soft. Bowel sounds are normal. He exhibits no distension. There is no tenderness.  Musculoskeletal: He exhibits no edema.  Has limited range of motion to the right lower extremity; is able to move all other extremities   Neurological: He is alert.  Skin: Skin is warm and dry. He is not diaphoretic.  Incision line without signs of infection present;  Right heel: 5 x 6 cm unstaged due to slough induration present no signs of infection present treated with santyl  Right great toe medial tip: arterial: 1.2 x 2 cm: no drainage no signs of infection present. Treat with skin prep Left great toe: 1.4 x 2 cm arterial no drainage no signs of infection present. Treat with skin prep      ASSESSMENT/ PLAN:  1. Right heel pressure ulceration: will continue his current plan of care and treatment; if there is no improvement in the next week; will debride his heel next week; will monitor his status.

## 2013-05-19 ENCOUNTER — Non-Acute Institutional Stay (SKILLED_NURSING_FACILITY): Payer: Medicare Other | Admitting: Adult Health

## 2013-05-19 DIAGNOSIS — F0390 Unspecified dementia without behavioral disturbance: Secondary | ICD-10-CM

## 2013-05-19 DIAGNOSIS — K5900 Constipation, unspecified: Secondary | ICD-10-CM

## 2013-05-19 DIAGNOSIS — L8995 Pressure ulcer of unspecified site, unstageable: Secondary | ICD-10-CM

## 2013-05-19 DIAGNOSIS — K219 Gastro-esophageal reflux disease without esophagitis: Secondary | ICD-10-CM

## 2013-05-19 DIAGNOSIS — I4891 Unspecified atrial fibrillation: Secondary | ICD-10-CM

## 2013-05-19 DIAGNOSIS — L89609 Pressure ulcer of unspecified heel, unspecified stage: Secondary | ICD-10-CM

## 2013-05-19 DIAGNOSIS — S7290XA Unspecified fracture of unspecified femur, initial encounter for closed fracture: Secondary | ICD-10-CM

## 2013-05-19 DIAGNOSIS — Z950 Presence of cardiac pacemaker: Secondary | ICD-10-CM

## 2013-05-19 DIAGNOSIS — J449 Chronic obstructive pulmonary disease, unspecified: Secondary | ICD-10-CM

## 2013-05-19 DIAGNOSIS — I10 Essential (primary) hypertension: Secondary | ICD-10-CM

## 2013-05-19 DIAGNOSIS — L89610 Pressure ulcer of right heel, unstageable: Secondary | ICD-10-CM

## 2013-05-19 DIAGNOSIS — S7291XA Unspecified fracture of right femur, initial encounter for closed fracture: Secondary | ICD-10-CM

## 2013-05-20 ENCOUNTER — Non-Acute Institutional Stay (SKILLED_NURSING_FACILITY): Payer: Medicare Other | Admitting: Adult Health

## 2013-05-20 DIAGNOSIS — S7291XA Unspecified fracture of right femur, initial encounter for closed fracture: Secondary | ICD-10-CM

## 2013-05-20 DIAGNOSIS — L89609 Pressure ulcer of unspecified heel, unspecified stage: Secondary | ICD-10-CM

## 2013-05-20 DIAGNOSIS — J449 Chronic obstructive pulmonary disease, unspecified: Secondary | ICD-10-CM

## 2013-05-20 DIAGNOSIS — S7290XA Unspecified fracture of unspecified femur, initial encounter for closed fracture: Secondary | ICD-10-CM

## 2013-05-20 DIAGNOSIS — L8995 Pressure ulcer of unspecified site, unstageable: Secondary | ICD-10-CM

## 2013-05-20 DIAGNOSIS — I10 Essential (primary) hypertension: Secondary | ICD-10-CM

## 2013-05-20 DIAGNOSIS — L89610 Pressure ulcer of right heel, unstageable: Secondary | ICD-10-CM

## 2013-05-26 ENCOUNTER — Encounter: Payer: Self-pay | Admitting: Adult Health

## 2013-05-26 MED ORDER — MEMANTINE HCL ER 7 MG PO CP24: 14.0000 mg | ORAL_CAPSULE | Freq: Every day | ORAL | Status: DC

## 2013-05-26 NOTE — Progress Notes (Signed)
Patient ID: Matthew Floyd, male   DOB: [birth year], 80 y.o.   MRN: [id number]     [facility]  Allergies  Allergen Reactions  . Codeine   . Flecainide     H/o CAD  . Morphine And Related   . Multaq [Dronedarone]   . Penicillins     Chief Complaint  Patient presents with  . Discharge Note    HPI: He had been hospitalized for right hip fracture. He was admitted to this facility for short term rehab. He will be returning home with home health for pt/ot/nursing/ home health aid. He will need a wheelchair in order him to maintain his current level of independence with his adl's. He will need a 3:1 commode and a neb machine.  He will need prescriptions to be written.    Past Medical History  Diagnosis Date  . A-fib   . Pacemaker -Biotronik   . CHF (congestive heart failure)     EF 50% on echo 2013, 44% on nuclear stufy 07/2011  . Anemia, pernicious   . Arthritis   . Back pain   . Cerebral aneurysm   . COPD (chronic obstructive pulmonary disease)   . DDD (degenerative disc disease)   . GERD (gastroesophageal reflux disease)     h/o peptic ulcer  . HTN (hypertension)   . Subdural hematoma   . History of skin cancer   . Cigarette smoker   . Valvular disease     MR/TR on echo prev  . Dementia   . MI (myocardial infarction)   . BPH (benign prostatic hyperplasia)   . CAD (coronary artery disease)     s/p stent placement  . Enlarged prostate   . Dementia   . Fall at home     Past Surgical History  Procedure Laterality Date  . Finger amputation      traumatic  . Angioplasty    . Hernia repair    . Burr hole for subdural hematoma      with evacuation  . Pacemaker placement      biotronik 10/12/09  . Partial gastrectomy    . Cholecystectomy    . Cardiac catheterization    . Coronary angioplasty    . Insert / replace / remove pacemaker    . Orif right hip      VITAL SIGNS BP 126/57  Pulse 70  Ht 6\' 2"  (1.88 m)  Wt 143 lb (64.864 kg)  BMI 18.35  kg/m2   Patient's Medications  New Prescriptions   No medications on file  Previous Medications   ALBUTEROL (PROVENTIL) (2.5 MG/3ML) 0.083% NEBULIZER SOLUTION    Take 3 mLs (2.5 mg total) by nebulization every 6 (six) hours as needed for wheezing or shortness of breath.   DIGOXIN (LANOXIN) 0.125 MG TABLET    Take 1 tablet (0.125 mg total) by mouth daily.   DILTIAZEM (CARDIZEM CD) 120 MG 24 HR CAPSULE    Take 1 capsule (120 mg total) by mouth daily.   MEMANTINE HCL ER (NAMENDA XR) 7 MG CP24    Take 2 capsules (14 mg total) by mouth daily.   METOPROLOL SUCCINATE (TOPROL-XL) 25 MG 24 HR TABLET    Take 0.5 tablets (12.5 mg total) by mouth 2 (two) times daily.   RANITIDINE (ZANTAC) 150 MG TABLET    Take 150 mg by mouth 2 (two) times daily.   RIVAROXABAN (XARELTO) 20 MG TABS TABLET    Take 15 mg by mouth daily with supper.  SENNOSIDES-DOCUSATE SODIUM (SENOKOT-S) 8.6-50 MG TABLET    Take 1 tablet by mouth 2 (two) times daily.   TAMSULOSIN (FLOMAX) 0.4 MG CAPS CAPSULE    Take 1 capsule (0.4 mg total) by mouth daily.   TRAMADOL (ULTRAM) 50 MG TABLET    Take one tablet by mouth every 6 hours as needed for pain 1-5; Take two tablets by mouth every 6 hours as needed for pain 6-10  Modified Medications   No medications on file  Discontinued Medications   No medications on file    SIGNIFICANT DIAGNOSTIC EXAMS  04-14-13: chest x-ray: no active disease; again noted hyperinflation and chronic mild interstitial prominence  04-14-13: right hip x-ray: minimally displaced intertrochanteric right femur fracture  04-18-13: right femur x-ray: a medullary rod and compression screw are now seen. The fracture fragments of the proximal right femur are in anatomic alignment. No acute abnormality seen.   04-28-13: ABI: RIGHT: 0.57; LEFT 0.75     LABS REVIEWED:   04-14-13: wbc 8.7; hgb 13.3; hct 39.9; mcv 94; plt 219; glucose 133; bun 16; creat 1.17; k+2.9; na++140; liver normal albumin 3.1  04-16-13: wbc 9.6;  hgb 11.0; hct 32.7; mcv 94 ;plt 145 glucose 81; bun 12; creat 0.84; k+3.6; na++136  04-18-13: wbc 7.6; hgb 11.6; hct 33.0; mcv 93; plt 158; glucose 98; bun 10; creat 1.01; k+3.6;na++ 137 04-23-13:wbc 7.8; hgb 10.1; hct 30.9; mcv 98.1; plt 308; glucose 126; bun 12;creat 1.2; k+3.8;na++137 04-30-13: wbc 7.3; hgb 11.6; hct 34.0; mcv 95.5; plt 509; glucose 79; bun 14; creat 1.0; k+4.3; na++ 138 05-19-13: urine culture: k pneumoniae: levaquin     Review of Systems  Unable to perform ROS   Physical Exam  Constitutional: No distress.  frail  Neck: Neck supple. No JVD present.  Cardiovascular: Normal rate and intact distal pulses.   Heart rate irregular   Respiratory: Effort normal and breath sounds normal. No respiratory distress.  02 dependent  GI: Soft. Bowel sounds are normal. He exhibits no distension. There is no tenderness.  Musculoskeletal: He exhibits no edema.  Has limited range of motion to the right lower extremity; is able to move all other extremities   Neurological: He is alert.  Skin: Skin is warm and dry. He is not diaphoretic.  Incision line without signs of infection present;  Right heel: 5 x 6 cm unstaged due to slough induration present no signs of infection present treated with santyl  Right great toe medial tip: arterial: 1.2 x 2 cm: no drainage no signs of infection present. Treat with skin prep Left great toe: 1.4 x 2 cm arterial no drainage no signs of infection present. Treat with skin prep      ASSESSMENT/ PLAN:  Will discharge him to home with home health for pt/ot/nursing/ home health aid. Will need a wheelchair with leg rests; 3;1 commode and a neb machine. His prescriptions have been written.   Time spent with patient 45 minutes.

## 2013-05-26 NOTE — Progress Notes (Signed)
Patient ID: Matthew RavelFred Floyd, male   DOB: 1923/05/21, 80 y.o.   MRN: [id number]     [facility]  Allergies  Allergen Reactions  . Codeine   . Flecainide     H/o CAD  . Morphine And Related   . Multaq [Dronedarone]   . Penicillins      Chief Complaint  Patient presents with  . Medical Managment of Chronic Issues    HPI:  He is being seen for the management of his chronic illnesses. There are no concerns being voiced by the nursing staff today. He is unable to fully participate in the hpi or ros; but states that he is feeling good.    Past Medical History  Diagnosis Date  . A-fib   . Pacemaker -Biotronik   . CHF (congestive heart failure)     EF 50% on echo 2013, 44% on nuclear stufy 07/2011  . Anemia, pernicious   . Arthritis   . Back pain   . Cerebral aneurysm   . COPD (chronic obstructive pulmonary disease)   . DDD (degenerative disc disease)   . GERD (gastroesophageal reflux disease)     h/o peptic ulcer  . HTN (hypertension)   . Subdural hematoma   . History of skin cancer   . Cigarette smoker   . Valvular disease     MR/TR on echo prev  . Dementia   . MI (myocardial infarction)   . BPH (benign prostatic hyperplasia)   . CAD (coronary artery disease)     s/p stent placement  . Enlarged prostate   . Dementia   . Fall at home     Past Surgical History  Procedure Laterality Date  . Finger amputation      traumatic  . Angioplasty    . Hernia repair    . Burr hole for subdural hematoma      with evacuation  . Pacemaker placement      biotronik 10/12/09  . Partial gastrectomy    . Cholecystectomy    . Cardiac catheterization    . Coronary angioplasty    . Insert / replace / remove pacemaker    . Orif right hip      VITAL SIGNS BP 108/60  Pulse 93  Ht 6\' 2"  (1.88 m)  Wt 143 lb (64.864 kg)  BMI 18.35 kg/m2   Patient's Medications  New Prescriptions   No medications on file  Previous Medications   ALBUTEROL (PROVENTIL) (2.5 MG/3ML) 0.083%  NEBULIZER SOLUTION    Take 3 mLs (2.5 mg total) by nebulization every 6 (six) hours as needed for wheezing or shortness of breath.   DIGOXIN (LANOXIN) 0.125 MG TABLET    Take 1 tablet (0.125 mg total) by mouth daily.   DILTIAZEM (CARDIZEM CD) 120 MG 24 HR CAPSULE    Take 1 capsule (120 mg total) by mouth daily.   MEMANTINE HCL ER (NAMENDA XR) 7 MG CP24    Take 1 capsule (7 mg total) by mouth daily.   METOPROLOL SUCCINATE (TOPROL-XL) 25 MG 24 HR TABLET    Take 0.5 tablets (12.5 mg total) by mouth 2 (two) times daily.   RIVAROXABAN (XARELTO) 20 MG TABS TABLET    Take 15 mg by mouth daily with supper.   SENNOSIDES-DOCUSATE SODIUM (SENOKOT-S) 8.6-50 MG TABLET    Take 1 tablet by mouth 2 (two) times daily.   TAMSULOSIN (FLOMAX) 0.4 MG CAPS CAPSULE    Take 1 capsule (0.4 mg total) by mouth daily.  TRAMADOL (ULTRAM) 50 MG TABLET    Take one tablet by mouth every 6 hours as needed for pain 1-5; Take two tablets by mouth every 6 hours as needed for pain 6-10  Modified Medications   Modified Medication Previous Medication   RANITIDINE (ZANTAC) 150 MG TABLET ranitidine (ZANTAC) 150 MG tablet      Take 150 mg by mouth 2 (two) times daily.    Take 1 tablet (150 mg total) by mouth daily.  Discontinued Medications   No medications on file    SIGNIFICANT DIAGNOSTIC EXAMS  04-14-13: chest x-ray: no active disease; again noted hyperinflation and chronic mild interstitial prominence  04-14-13: right hip x-ray: minimally displaced intertrochanteric right femur fracture  04-18-13: right femur x-ray: a medullary rod and compression screw are now seen. The fracture fragments of the proximal right femur are in anatomic alignment. No acute abnormality seen.   04-28-13: ABI: RIGHT: 0.57; LEFT 0.75     LABS REVIEWED:   04-14-13: wbc 8.7; hgb 13.3; hct 39.9; mcv 94; plt 219; glucose 133; bun 16; creat 1.17; k+2.9; na++140; liver normal albumin 3.1  04-16-13: wbc 9.6; hgb 11.0; hct 32.7; mcv 94 ;plt 145 glucose  81; bun 12; creat 0.84; k+3.6; na++136  04-18-13: wbc 7.6; hgb 11.6; hct 33.0; mcv 93; plt 158; glucose 98; bun 10; creat 1.01; k+3.6;na++ 137 04-23-13:wbc 7.8; hgb 10.1; hct 30.9; mcv 98.1; plt 308; glucose 126; bun 12;creat 1.2; k+3.8;na++137 04-30-13: wbc 7.3; hgb 11.6; hct 34.0; mcv 95.5; plt 509; glucose 79; bun 14; creat 1.0; k+4.3; na++ 138 05-19-13: urine culture: k pneumoniae: levaquin     Review of Systems  Unable to perform ROS   Physical Exam  Constitutional: No distress.  frail  Neck: Neck supple. No JVD present.  Cardiovascular: Normal rate and intact distal pulses.   Heart rate irregular   Respiratory: Effort normal and breath sounds normal. No respiratory distress.  02 dependent  GI: Soft. Bowel sounds are normal. He exhibits no distension. There is no tenderness.  Musculoskeletal: He exhibits no edema.  Has limited range of motion to the right lower extremity; is able to move all other extremities   Neurological: He is alert.  Skin: Skin is warm and dry. He is not diaphoretic.  Incision line without signs of infection present;  Right heel: 5 x 6 cm unstaged due to slough induration present no signs of infection present treated with santyl  Right great toe medial tip: arterial: 1.2 x 2 cm: no drainage no signs of infection present. Treat with skin prep Left great toe: 1.4 x 2 cm arterial no drainage no signs of infection present. Treat with skin prep       ASSESSMENT/ PLAN:  1. Afib; his heart rate is stable will continue digoxin 0.125 mg daily for rate control and will continue xarelto 15 mg daily and will monitor   2. Hypertension; he is stable will continue toprol xl 12.5 mg twice daily and cardizem cd 120 mg daily and will monitor his status  3. Right femur fracture: will continue therapy as directed; will continue ultram 50 or 100 mg every 6 hours as needed for pain and will monitor  4. Dementia: No overall change in status will increase his namenda xr to  14 mg daily and will monitor  5. BPH: will continue flomax 0.4 mg daily   6. Gerd: will continue zantac 15 mg twice daily   7. Constipation; will continue senna s twice daily   8. Copd:  he is using 02 prn; has albuterol 2 puffs every 6 hours as needed will not make changes and will monitor  9. Right heel ulceration; will continue current treatment and will monitor

## 2013-05-29 ENCOUNTER — Encounter: Payer: Self-pay | Admitting: Adult Health

## 2013-05-29 ENCOUNTER — Non-Acute Institutional Stay (SKILLED_NURSING_FACILITY): Payer: Medicare Other | Admitting: Adult Health

## 2013-05-29 DIAGNOSIS — L89609 Pressure ulcer of unspecified heel, unspecified stage: Secondary | ICD-10-CM

## 2013-05-29 DIAGNOSIS — L8995 Pressure ulcer of unspecified site, unstageable: Secondary | ICD-10-CM

## 2013-05-29 DIAGNOSIS — L89610 Pressure ulcer of right heel, unstageable: Secondary | ICD-10-CM

## 2013-05-29 NOTE — Progress Notes (Signed)
Patient ID: Matthew Floyd, male   DOB: 1923/07/15, 80 y.o.   MRN: [id number]     Matthew Floyd  Allergies  Allergen Reactions  . Codeine   . Flecainide     H/o CAD  . Morphine And Related   . Multaq [Dronedarone]   . Penicillins      Chief Complaint  Patient presents with  . Acute Visit    wound management     HPI:  His right heel ulceration is slowly healing. The wound does look better; but there remains slough and eschar present in the wound bed. He will require some debridement in order to help the wound to resolve more rapidly. His pain medication is effective at this time.   Past Medical History  Diagnosis Date  . A-fib   . Pacemaker -Biotronik   . CHF (congestive heart failure)     EF 50% on echo 2013, 44% on nuclear stufy 07/2011  . Anemia, pernicious   . Arthritis   . Back pain   . Cerebral aneurysm   . COPD (chronic obstructive pulmonary disease)   . DDD (degenerative disc disease)   . GERD (gastroesophageal reflux disease)     h/o peptic ulcer  . HTN (hypertension)   . Subdural hematoma   . History of skin cancer   . Cigarette smoker   . Valvular disease     MR/TR on echo prev  . Dementia   . MI (myocardial infarction)   . BPH (benign prostatic hyperplasia)   . CAD (coronary artery disease)     s/p stent placement  . Enlarged prostate   . Dementia   . Fall at home     Past Surgical History  Procedure Laterality Date  . Finger amputation      traumatic  . Angioplasty    . Hernia repair    . Burr hole for subdural hematoma      with evacuation  . Pacemaker placement      biotronik 10/12/09  . Partial gastrectomy    . Cholecystectomy    . Cardiac catheterization    . Coronary angioplasty    . Insert / replace / remove pacemaker    . Orif right hip      VITAL SIGNS BP 121/74  Pulse 75  Ht 6\' 2"  (1.88 m)  Wt 142 lb 3.2 oz (64.501 kg)  BMI 18.25 kg/m2   Patient's Medications  New Prescriptions   No medications on file  Previous  Medications   ALBUTEROL (PROVENTIL) (2.5 MG/3ML) 0.083% NEBULIZER SOLUTION    Take 3 mLs (2.5 mg total) by nebulization every 6 (six) hours as needed for wheezing or shortness of breath.   DIGOXIN (LANOXIN) 0.125 MG TABLET    Take 1 tablet (0.125 mg total) by mouth daily.   DILTIAZEM (CARDIZEM CD) 120 MG 24 HR CAPSULE    Take 1 capsule (120 mg total) by mouth daily.   MEMANTINE HCL ER (NAMENDA XR) 7 MG CP24    Take 2 capsules (14 mg total) by mouth daily.   METOPROLOL SUCCINATE (TOPROL-XL) 25 MG 24 HR TABLET    Take 0.5 tablets (12.5 mg total) by mouth 2 (two) times daily.   RANITIDINE (ZANTAC) 150 MG TABLET    Take 150 mg by mouth 2 (two) times daily.   RIVAROXABAN (XARELTO) 20 MG TABS TABLET    Take 15 mg by mouth daily with supper.   SENNOSIDES-DOCUSATE SODIUM (SENOKOT-S) 8.6-50 MG TABLET    Take 1  tablet by mouth 2 (two) times daily.   TAMSULOSIN (FLOMAX) 0.4 MG CAPS CAPSULE    Take 1 capsule (0.4 mg total) by mouth daily.   TRAMADOL (ULTRAM) 50 MG TABLET    Take one tablet by mouth every 6 hours as needed for pain 1-5; Take two tablets by mouth every 6 hours as needed for pain 6-10  Modified Medications   No medications on file  Discontinued Medications   No medications on file    SIGNIFICANT DIAGNOSTIC EXAMS  04-14-13: chest x-ray: no active disease; again noted hyperinflation and chronic mild interstitial prominence  04-14-13: right hip x-ray: minimally displaced intertrochanteric right femur fracture  04-18-13: right femur x-ray: a medullary rod and compression screw are now seen. The fracture fragments of the proximal right femur are in anatomic alignment. No acute abnormality seen.   04-28-13: ABI: RIGHT: 0.57; LEFT 0.75     LABS REVIEWED:   04-14-13: wbc 8.7; hgb 13.3; hct 39.9; mcv 94; plt 219; glucose 133; bun 16; creat 1.17; k+2.9; na++140; liver normal albumin 3.1  04-16-13: wbc 9.6; hgb 11.0; hct 32.7; mcv 94 ;plt 145 glucose 81; bun 12; creat 0.84; k+3.6; na++136    04-18-13: wbc 7.6; hgb 11.6; hct 33.0; mcv 93; plt 158; glucose 98; bun 10; creat 1.01; k+3.6;na++ 137 04-23-13:wbc 7.8; hgb 10.1; hct 30.9; mcv 98.1; plt 308; glucose 126; bun 12;creat 1.2; k+3.8;na++137 04-30-13: wbc 7.3; hgb 11.6; hct 34.0; mcv 95.5; plt 509; glucose 79; bun 14; creat 1.0; k+4.3; na++ 138 05-19-13: urine culture: k pneumoniae: levaquin     Review of Systems  Unable to perform ROS   Physical Exam  Constitutional: No distress.  frail  Neck: Neck supple. No JVD present.  Cardiovascular: Normal rate and intact distal pulses.   Heart rate irregular   Respiratory: Effort normal and breath sounds normal. No respiratory distress.  02 dependent  GI: Soft. Bowel sounds are normal. He exhibits no distension. There is no tenderness.  Musculoskeletal: He exhibits no edema.  Has limited range of motion to the right lower extremity; is able to move all other extremities   Neurological: He is alert.  Skin: Skin is warm and dry. He is not diaphoretic.  Incision line without signs of infection present;  Right heel:4 x 6 cm has 45% eschar tissue 5% slough and 50 % granulating tissue present. I have debrided the wound bed in order help remove the eschar and slough. There are no signs of infection present.      ASSESSMENT/ PLAN:  1. Right heel wound: will continue his current plan of care and will continue to monitor his status.

## 2013-05-30 ENCOUNTER — Ambulatory Visit (INDEPENDENT_AMBULATORY_CARE_PROVIDER_SITE_OTHER): Payer: Medicare Other | Admitting: Cardiovascular Disease

## 2013-05-30 ENCOUNTER — Encounter: Payer: Self-pay | Admitting: Cardiovascular Disease

## 2013-05-30 ENCOUNTER — Non-Acute Institutional Stay (SKILLED_NURSING_FACILITY): Payer: Medicare Other | Admitting: Adult Health

## 2013-05-30 VITALS — BP 90/60 | HR 89 | Ht 71.0 in | Wt 142.0 lb

## 2013-05-30 DIAGNOSIS — I2510 Atherosclerotic heart disease of native coronary artery without angina pectoris: Secondary | ICD-10-CM

## 2013-05-30 DIAGNOSIS — M549 Dorsalgia, unspecified: Secondary | ICD-10-CM

## 2013-05-30 DIAGNOSIS — M51379 Other intervertebral disc degeneration, lumbosacral region without mention of lumbar back pain or lower extremity pain: Secondary | ICD-10-CM

## 2013-05-30 DIAGNOSIS — M5137 Other intervertebral disc degeneration, lumbosacral region: Secondary | ICD-10-CM

## 2013-05-30 DIAGNOSIS — I10 Essential (primary) hypertension: Secondary | ICD-10-CM

## 2013-05-30 DIAGNOSIS — I4891 Unspecified atrial fibrillation: Secondary | ICD-10-CM

## 2013-05-30 DIAGNOSIS — M5136 Other intervertebral disc degeneration, lumbar region: Secondary | ICD-10-CM

## 2013-05-30 NOTE — Assessment & Plan Note (Signed)
Blood pressure is currently low and thus I recommend stopping treatment with metoprolol and continuing diltiazem and small dose digoxin. Digoxin can likely be discontinued in the future. The patient had recent hematuria and also recurrent falls. He is mostly now wheelchair bound. I feel that he is at significant risk for thromboembolic complications related to atrial fibrillation and also possible DVT. Thus, it might be reasonable to continue low-dose Xarelto 15 mg once daily for now and reevaluate him in few months to see if anticoagulation should be stopped.

## 2013-05-30 NOTE — Patient Instructions (Signed)
Your physician has recommended you make the following change in your medication:  Stop Metoprolol  Continue other meds    Your physician recommends that you schedule a follow-up appointment in:  2 months

## 2013-05-30 NOTE — Progress Notes (Signed)
Primary care physician: Dr. Para March  HPI  This is an 80 year old man who is here today for a followup visit.  He moved from [place], [place] to live with his son last year. He has known history of coronary artery disease with previous stenting, atrial fibrillation, permanent pacemaker placement, COPD with active tobacco use and dementia.    EF 60% on echo 2013 per old records.  H/o dementia, worsened short term memory in the last 1-2 years. On aricept and compliant. Son helps with meds, supervises patient.  He was started on anticoagulation with Xarelto in November. He fell last month and broke his right hip. He was hospitalized at [facility] and underwent surgical repair. He continues to have hard time with recovery and currently he is staying at [facility]. He is still not able to walk without significant assistance. He also developed a pressure ulcer on the right heel. His dementia seems to be worsening. He lost 10 pounds since his last visit. He is not able to provide much history. He is accompanied by his son.  Allergies  Allergen Reactions  . Codeine   . Flecainide     H/o CAD  . Morphine And Related   . Multaq [Dronedarone]   . Penicillins      Current Outpatient Prescriptions on File Prior to Visit  Medication Sig Dispense Refill  . albuterol (PROVENTIL) (2.5 MG/3ML) 0.083% nebulizer solution Take 3 mLs (2.5 mg total) by nebulization every 6 (six) hours as needed for wheezing or shortness of breath.  75 mL  12  . digoxin (LANOXIN) 0.125 MG tablet Take 1 tablet (0.125 mg total) by mouth daily.  30 tablet  12  . diltiazem (CARDIZEM CD) 120 MG 24 hr capsule Take 1 capsule (120 mg total) by mouth daily.  30 capsule  12  . Memantine HCl ER (NAMENDA XR) 7 MG CP24 Take 2 capsules (14 mg total) by mouth daily.  30 capsule  11  . metoprolol succinate (TOPROL-XL) 25 MG 24 hr tablet Take 0.5 tablets (12.5 mg total) by mouth 2 (two) times daily.  30 tablet  12  . ranitidine (ZANTAC) 150 MG  tablet Take 150 mg by mouth 2 (two) times daily.      . Rivaroxaban (XARELTO) 20 MG TABS tablet Take 15 mg by mouth daily with supper.      . sennosides-docusate sodium (SENOKOT-S) 8.6-50 MG tablet Take 1 tablet by mouth 2 (two) times daily.      . tamsulosin (FLOMAX) 0.4 MG CAPS capsule Take 1 capsule (0.4 mg total) by mouth daily.  30 capsule  12  . traMADol (ULTRAM) 50 MG tablet Take one tablet by mouth every 6 hours as needed for pain 1-5; Take two tablets by mouth every 6 hours as needed for pain 6-10  360 tablet  5   No current facility-administered medications on file prior to visit.     Past Medical History  Diagnosis Date  . A-fib   . Pacemaker -Biotronik   . CHF (congestive heart failure)     EF 50% on echo 2013, 44% on nuclear stufy 07/2011  . Anemia, pernicious   . Arthritis   . Back pain   . Cerebral aneurysm   . COPD (chronic obstructive pulmonary disease)   . DDD (degenerative disc disease)   . GERD (gastroesophageal reflux disease)     h/o peptic ulcer  . HTN (hypertension)   . Subdural hematoma   . History of skin cancer   .  Cigarette smoker   . Valvular disease     MR/TR on echo prev  . Dementia   . MI (myocardial infarction)   . BPH (benign prostatic hyperplasia)   . CAD (coronary artery disease)     s/p stent placement  . Enlarged prostate   . Dementia   . Fall at home      Past Surgical History  Procedure Laterality Date  . Finger amputation      traumatic  . Angioplasty    . Hernia repair    . Burr hole for subdural hematoma      with evacuation  . Pacemaker placement      biotronik 10/12/09  . Partial gastrectomy    . Cholecystectomy    . Cardiac catheterization    . Coronary angioplasty    . Insert / replace / remove pacemaker    . Orif right hip       Family History  Problem Relation Age of Onset  . Dementia Mother   . Heart disease Father   . Heart disease Brother      History   Social History  . Marital Status: Widowed     Spouse Name: N/A    Number of Children: N/A  . Years of Education: N/A   Occupational History  . Not on file.   Social History Main Topics  . Smoking status: Current Every Day Smoker -- 0.50 packs/day for 65 years    Types: Cigarettes    Start date: 04/24/1952  . Smokeless tobacco: Never Used  . Alcohol Use: No  . Drug Use: No  . Sexual Activity: Not on file   Other Topics Concern  . Not on file   Social History Narrative   Retired [employer] therapist[employer]   Lives with son locally.     Widowed 2011      PHYSICAL EXAM   BP 90/60  Ht 5\' 11"  (1.803 m)  Wt 142 lb (64.411 kg)  BMI 19.81 kg/m2 Constitutional: He is oriented to person, place, and time. He appears frail. No distress.  HENT: No nasal discharge.  Head: Normocephalic and atraumatic.  Eyes: Pupils are equal and round.  No discharge. Neck: Normal range of motion. Neck supple. No JVD present. No thyromegaly present.  Cardiovascular: Normal rate, regular rhythm, normal heart sounds. Exam reveals no gallop and no friction rub. No murmur heard.  Pulmonary/Chest: Effort normal and breath sounds normal. No stridor. No respiratory distress. He has no wheezes. He has no rales. He exhibits no tenderness.  Abdominal: Soft. Bowel sounds are normal. He exhibits no distension. There is no tenderness. There is no rebound and no guarding.  Musculoskeletal: Normal range of motion. He exhibits no edema and no tenderness.  Neurological: He is alert and oriented to person, place, and time. Coordination normal.  Skin: Skin is warm and dry. Thin skin with bruising. He is not diaphoretic. No erythema. No pallor.  Psychiatric:  His behavior is normal. Judgment and thought content  or abnormal .    EKG: Ventricular paced rhythm likely with underlying atrial fibrillation.  ASSESSMENT AND PLAN

## 2013-05-30 NOTE — Assessment & Plan Note (Signed)
Blood pressure is low. Metoprolol was discontinued.

## 2013-05-30 NOTE — Assessment & Plan Note (Signed)
He reports no symptoms of angina.   

## 2013-06-03 ENCOUNTER — Encounter: Payer: Self-pay | Admitting: Adult Health

## 2013-06-03 ENCOUNTER — Non-Acute Institutional Stay (SKILLED_NURSING_FACILITY): Payer: Medicare Other | Admitting: Adult Health

## 2013-06-03 DIAGNOSIS — F0390 Unspecified dementia without behavioral disturbance: Secondary | ICD-10-CM

## 2013-06-03 DIAGNOSIS — M51379 Other intervertebral disc degeneration, lumbosacral region without mention of lumbar back pain or lower extremity pain: Secondary | ICD-10-CM

## 2013-06-03 DIAGNOSIS — S7290XA Unspecified fracture of unspecified femur, initial encounter for closed fracture: Secondary | ICD-10-CM

## 2013-06-03 DIAGNOSIS — M5137 Other intervertebral disc degeneration, lumbosacral region: Secondary | ICD-10-CM

## 2013-06-03 DIAGNOSIS — M549 Dorsalgia, unspecified: Secondary | ICD-10-CM

## 2013-06-03 DIAGNOSIS — M5136 Other intervertebral disc degeneration, lumbar region: Secondary | ICD-10-CM

## 2013-06-03 DIAGNOSIS — S7291XA Unspecified fracture of right femur, initial encounter for closed fracture: Secondary | ICD-10-CM

## 2013-06-03 MED ORDER — NAPROXEN SODIUM 220 MG PO TABS: 220.0000 mg | ORAL_TABLET | Freq: Two times a day (BID) | ORAL | Status: DC

## 2013-06-03 MED ORDER — METHOCARBAMOL 500 MG PO TABS
500.0000 mg | ORAL_TABLET | Freq: Two times a day (BID) | ORAL | Status: DC
Start: 2013-06-03 — End: 2013-07-17

## 2013-06-03 NOTE — Progress Notes (Signed)
Patient ID: Matthew RavelFred Floyd, male   DOB: 1924/03/17, 80 y.o.   MRN: [id number]     [facility]  Allergies  Allergen Reactions  . Codeine   . Flecainide     H/o CAD  . Morphine And Related   . Multaq [Dronedarone]   . Penicillins      Chief Complaint  Patient presents with  . Acute Visit    back pain    HPI:  Staff reports that he is having increased back pain. He has a history of DDD. He has had falls which have aggravated his chronic back pain. He is a poor historian and cannot fully participate in the hpi or ros. He states his back hurts all the time and all over.   Past Medical History  Diagnosis Date  . A-fib   . Pacemaker -Biotronik   . CHF (congestive heart failure)     EF 50% on echo 2013, 44% on nuclear stufy 07/2011  . Anemia, pernicious   . Arthritis   . Back pain   . Cerebral aneurysm   . COPD (chronic obstructive pulmonary disease)   . DDD (degenerative disc disease)   . GERD (gastroesophageal reflux disease)     h/o peptic ulcer  . HTN (hypertension)   . Subdural hematoma   . History of skin cancer   . Cigarette smoker   . Valvular disease     MR/TR on echo prev  . Dementia   . MI (myocardial infarction)   . BPH (benign prostatic hyperplasia)   . CAD (coronary artery disease)     s/p stent placement  . Enlarged prostate   . Dementia   . Fall at home   . Ulcer     right heel  . Broken hip   . UTI (urinary tract infection)     Past Surgical History  Procedure Laterality Date  . Finger amputation      traumatic  . Angioplasty    . Hernia repair    . Burr hole for subdural hematoma      with evacuation  . Pacemaker placement      biotronik 10/12/09  . Partial gastrectomy    . Cholecystectomy    . Cardiac catheterization    . Coronary angioplasty    . Insert / replace / remove pacemaker    . Orif right hip    . Hip surgery      VITAL SIGNS BP 110/68  Pulse 70  Ht 6\' 2"  (1.88 m)  Wt 143 lb (64.864 kg)  BMI 18.35  kg/m2   Patient's Medications  New Prescriptions   No medications on file  Previous Medications   ALBUTEROL (PROVENTIL) (2.5 MG/3ML) 0.083% NEBULIZER SOLUTION    Take 3 mLs (2.5 mg total) by nebulization every 6 (six) hours as needed for wheezing or shortness of breath.   COLLAGENASE (SANTYL) OINTMENT    Apply 1 application topically daily.   DIGOXIN (LANOXIN) 0.125 MG TABLET    Take 1 tablet (0.125 mg total) by mouth daily.   DILTIAZEM (CARDIZEM CD) 120 MG 24 HR CAPSULE    Take 1 capsule (120 mg total) by mouth daily.   MEMANTINE HCL ER (NAMENDA XR) 7 MG CP24    Take 2 capsules (14 mg total) by mouth daily.   MULTIVITAMIN WITH MINERALS (CERTA-VITE) LIQD    Take 5 mLs by mouth daily.   NICOTINE (NICODERM CQ - DOSED IN MG/24 HOURS) 21 MG/24HR PATCH    Place 21  mg onto the skin daily.   OSTOMY SUPPLIES (SKIN PREP WIPES) MISC    by Does not apply route 2 (two) times daily.   OXYCODONE (OXY-IR) 5 MG CAPSULE    Take 10 mg by mouth every 6 (six) hours as needed.   RANITIDINE (ZANTAC) 150 MG TABLET    Take 150 mg by mouth 2 (two) times daily.   RIVAROXABAN (XARELTO) 15 MG TABS TABLET    Take 15 mg by mouth daily with supper.   SENNOSIDES-DOCUSATE SODIUM (SENOKOT-S) 8.6-50 MG TABLET    Take 1 tablet by mouth 2 (two) times daily.   TAMSULOSIN (FLOMAX) 0.4 MG CAPS CAPSULE    Take 1 capsule (0.4 mg total) by mouth daily.   TRAMADOL (ULTRAM) 50 MG TABLET    Take one tablet by mouth every 6 hours as needed for pain 1-5; Take two tablets by mouth every 6 hours as needed for pain 6-10   ZINC OXIDE (BALMEX) 11.3 % CREA CREAM    Apply 1 application topically daily.  Modified Medications   No medications on file  Discontinued Medications   No medications on file    SIGNIFICANT DIAGNOSTIC EXAMS  04-14-13: chest x-ray: no active disease; again noted hyperinflation and chronic mild interstitial prominence  04-14-13: right hip x-ray: minimally displaced intertrochanteric right femur fracture  04-18-13:  right femur x-ray: a medullary rod and compression screw are now seen. The fracture fragments of the proximal right femur are in anatomic alignment. No acute abnormality seen.   04-28-13: ABI: RIGHT: 0.57; LEFT 0.75     LABS REVIEWED:   04-14-13: wbc 8.7; hgb 13.3; hct 39.9; mcv 94; plt 219; glucose 133; bun 16; creat 1.17; k+2.9; na++140; liver normal albumin 3.1  04-16-13: wbc 9.6; hgb 11.0; hct 32.7; mcv 94 ;plt 145 glucose 81; bun 12; creat 0.84; k+3.6; na++136  04-18-13: wbc 7.6; hgb 11.6; hct 33.0; mcv 93; plt 158; glucose 98; bun 10; creat 1.01; k+3.6;na++ 137 04-23-13:wbc 7.8; hgb 10.1; hct 30.9; mcv 98.1; plt 308; glucose 126; bun 12;creat 1.2; k+3.8;na++137 04-30-13: wbc 7.3; hgb 11.6; hct 34.0; mcv 95.5; plt 509; glucose 79; bun 14; creat 1.0; k+4.3; na++ 138 05-19-13: urine culture: k pneumoniae: levaquin     Review of Systems  Unable to perform ROS   Physical Exam  Constitutional: No distress.  frail  Neck: Neck supple. No JVD present.  Cardiovascular: Normal rate and intact distal pulses.   Heart rate irregular   Respiratory: Effort normal and breath sounds normal. No respiratory distress.  02 dependent  GI: Soft. Bowel sounds are normal. He exhibits no distension. There is no tenderness.  Musculoskeletal: He exhibits no edema.  Has limited range of motion to the right lower extremity; is able to move all other extremities Has generalized tenderness on his back without focal area present.    Neurological: He is alert.  Skin: Skin is warm and dry. He is not diaphoretic.  Incision line without signs of infection present;  Right heel:4 x 6 cm has 45% eschar Floyd 5% slough and 50 % granulating Floyd present.There are no signs of infection present.     ASSESSMENT/ PLAN:  1. Back pain: will begin aleve twice daily; robaxin 500 mg twice daily and will have therapy evaluate and treat his back pain as indicated and will monitor his status.

## 2013-06-03 NOTE — Progress Notes (Signed)
Patient ID: Matthew Floyd, male   DOB: 1923-09-03, 80 y.o.   MRN: [id number]     [facility]  Allergies  Allergen Reactions  . Codeine   . Flecainide     H/o CAD  . Morphine And Related   . Multaq [Dronedarone]   . Penicillins      Chief Complaint  Patient presents with  . Acute Visit    family meeting     HPI:  We have had a care plan meeting in order to discuss his overall status. He is not doing as well as hoped with therapy and recovery from his hip fracture. He has had several falls since his admission to this facility. He is having increased back pain which is currently being treated. Per his family he has a "bulging disc".  We discussed different treatment options such as long term placement in skilled nursing or in assisted living environment such as memory care units. The memory care unit would be better suited for his needs with dementia. We had a prolonged discussion regarding his needs.    Past Medical History  Diagnosis Date  . A-fib   . Pacemaker -Biotronik   . CHF (congestive heart failure)     EF 50% on echo 2013, 44% on nuclear stufy 07/2011  . Anemia, pernicious   . Arthritis   . Back pain   . Cerebral aneurysm   . COPD (chronic obstructive pulmonary disease)   . DDD (degenerative disc disease)   . GERD (gastroesophageal reflux disease)     h/o peptic ulcer  . HTN (hypertension)   . Subdural hematoma   . History of skin cancer   . Cigarette smoker   . Valvular disease     MR/TR on echo prev  . Dementia   . MI (myocardial infarction)   . BPH (benign prostatic hyperplasia)   . CAD (coronary artery disease)     s/p stent placement  . Enlarged prostate   . Dementia   . Fall at home   . Ulcer     right heel  . Broken hip   . UTI (urinary tract infection)     Past Surgical History  Procedure Laterality Date  . Finger amputation      traumatic  . Angioplasty    . Hernia repair    . Burr hole for subdural hematoma      with evacuation  .  Pacemaker placement      biotronik 10/12/09  . Partial gastrectomy    . Cholecystectomy    . Cardiac catheterization    . Coronary angioplasty    . Insert / replace / remove pacemaker    . Orif right hip    . Hip surgery      VITAL SIGNS BP 128/80  Pulse 71  Ht 6\' 2"  (1.88 m)  Wt 142 lb 9.6 oz (64.683 kg)  BMI 18.30 kg/m2   Patient's Medications  New Prescriptions   No medications on file  Previous Medications   ALBUTEROL (PROVENTIL) (2.5 MG/3ML) 0.083% NEBULIZER SOLUTION    Take 3 mLs (2.5 mg total) by nebulization every 6 (six) hours as needed for wheezing or shortness of breath.   COLLAGENASE (SANTYL) OINTMENT    Apply 1 application topically daily.   DIGOXIN (LANOXIN) 0.125 MG TABLET    Take 1 tablet (0.125 mg total) by mouth daily.   DILTIAZEM (CARDIZEM CD) 120 MG 24 HR CAPSULE    Take 1 capsule (120 mg total) by  mouth daily.   MEMANTINE HCL ER (NAMENDA XR) 7 MG CP24    Take 2 capsules (14 mg total) by mouth daily.   METHOCARBAMOL (ROBAXIN) 500 MG TABLET    Take 1 tablet (500 mg total) by mouth 2 (two) times daily.   MULTIVITAMIN WITH MINERALS (CERTA-VITE) LIQD    Take 5 mLs by mouth daily.   NAPROXEN SODIUM (ALEVE) 220 MG TABLET    Take 1 tablet (220 mg total) by mouth 2 (two) times daily with a meal.   NICOTINE (NICODERM CQ - DOSED IN MG/24 HOURS) 21 MG/24HR PATCH    Place 21 mg onto the skin daily.   OSTOMY SUPPLIES (SKIN PREP WIPES) MISC    by Does not apply route 2 (two) times daily.   OXYCODONE (OXY-IR) 5 MG CAPSULE    Take 10 mg by mouth every 6 (six) hours as needed.   RANITIDINE (ZANTAC) 150 MG TABLET    Take 150 mg by mouth 2 (two) times daily.   RIVAROXABAN (XARELTO) 15 MG TABS TABLET    Take 15 mg by mouth daily with supper.   SENNOSIDES-DOCUSATE SODIUM (SENOKOT-S) 8.6-50 MG TABLET    Take 1 tablet by mouth 2 (two) times daily.   TAMSULOSIN (FLOMAX) 0.4 MG CAPS CAPSULE    Take 1 capsule (0.4 mg total) by mouth daily.   TRAMADOL (ULTRAM) 50 MG TABLET    Take one  tablet by mouth every 6 hours as needed for pain 1-5; Take two tablets by mouth every 6 hours as needed for pain 6-10   ZINC OXIDE (BALMEX) 11.3 % CREA CREAM    Apply 1 application topically daily.  Modified Medications   No medications on file  Discontinued Medications   No medications on file    SIGNIFICANT DIAGNOSTIC EXAMS  04-14-13: chest x-ray: no active disease; again noted hyperinflation and chronic mild interstitial prominence  04-14-13: right hip x-ray: minimally displaced intertrochanteric right femur fracture  04-18-13: right femur x-ray: a medullary rod and compression screw are now seen. The fracture fragments of the proximal right femur are in anatomic alignment. No acute abnormality seen.   04-28-13: ABI: RIGHT: 0.57; LEFT 0.75     LABS REVIEWED:   04-14-13: wbc 8.7; hgb 13.3; hct 39.9; mcv 94; plt 219; glucose 133; bun 16; creat 1.17; k+2.9; na++140; liver normal albumin 3.1  04-16-13: wbc 9.6; hgb 11.0; hct 32.7; mcv 94 ;plt 145 glucose 81; bun 12; creat 0.84; k+3.6; na++136  04-18-13: wbc 7.6; hgb 11.6; hct 33.0; mcv 93; plt 158; glucose 98; bun 10; creat 1.01; k+3.6;na++ 137 04-23-13:wbc 7.8; hgb 10.1; hct 30.9; mcv 98.1; plt 308; glucose 126; bun 12;creat 1.2; k+3.8;na++137 04-30-13: wbc 7.3; hgb 11.6; hct 34.0; mcv 95.5; plt 509; glucose 79; bun 14; creat 1.0; k+4.3; na++ 138 05-19-13: urine culture: k pneumoniae: levaquin     Review of Systems  Unable to perform ROS   Physical Exam  Constitutional: No distress.  frail  Neck: Neck supple. No JVD present.  Cardiovascular: Normal rate and intact distal pulses.   Heart rate irregular   Respiratory: Effort normal and breath sounds normal. No respiratory distress.  02 dependent  GI: Soft. Bowel sounds are normal. He exhibits no distension. There is no tenderness.  Musculoskeletal: He exhibits no edema.  Has limited range of motion to the right lower extremity; is able to move all other extremities Has  generalized tenderness on his back without focal area present.    Neurological: He is alert.  Skin: Skin is warm and dry. He is not diaphoretic.  Incision line without signs of infection present;  Right heel:4 x 6 cm has 45% eschar tissue 5% slough and 50 % granulating tissue present.There are no signs of infection present.      ASSESSMENT/ PLAN:  At this time; will continue his current plan of care. His family is going to start the process of looking into memory care units. This environment will provide him with a more appropriate environment for him. Will continue therapy as directed; will change his namenda to the am. Will continue to monitor his status.     Time spent with patient 50 minutes.

## 2013-06-16 ENCOUNTER — Non-Acute Institutional Stay (SKILLED_NURSING_FACILITY): Payer: Medicare Other | Admitting: Adult Health

## 2013-06-16 DIAGNOSIS — M549 Dorsalgia, unspecified: Secondary | ICD-10-CM

## 2013-06-16 DIAGNOSIS — F0390 Unspecified dementia without behavioral disturbance: Secondary | ICD-10-CM

## 2013-06-16 DIAGNOSIS — L8995 Pressure ulcer of unspecified site, unstageable: Secondary | ICD-10-CM

## 2013-06-16 DIAGNOSIS — K219 Gastro-esophageal reflux disease without esophagitis: Secondary | ICD-10-CM

## 2013-06-16 DIAGNOSIS — I10 Essential (primary) hypertension: Secondary | ICD-10-CM

## 2013-06-16 DIAGNOSIS — L89609 Pressure ulcer of unspecified heel, unspecified stage: Secondary | ICD-10-CM

## 2013-06-16 DIAGNOSIS — I4891 Unspecified atrial fibrillation: Secondary | ICD-10-CM

## 2013-06-16 DIAGNOSIS — L89610 Pressure ulcer of right heel, unstageable: Secondary | ICD-10-CM

## 2013-06-16 DIAGNOSIS — N400 Enlarged prostate without lower urinary tract symptoms: Secondary | ICD-10-CM

## 2013-06-16 DIAGNOSIS — J449 Chronic obstructive pulmonary disease, unspecified: Secondary | ICD-10-CM

## 2013-06-16 DIAGNOSIS — J4489 Other specified chronic obstructive pulmonary disease: Secondary | ICD-10-CM

## 2013-06-16 DIAGNOSIS — K5900 Constipation, unspecified: Secondary | ICD-10-CM

## 2013-06-16 DIAGNOSIS — Z950 Presence of cardiac pacemaker: Secondary | ICD-10-CM

## 2013-06-17 ENCOUNTER — Encounter: Payer: Self-pay | Admitting: Adult Health

## 2013-06-17 ENCOUNTER — Other Ambulatory Visit: Payer: Self-pay | Admitting: Internal Medicine

## 2013-06-17 DIAGNOSIS — M869 Osteomyelitis, unspecified: Secondary | ICD-10-CM

## 2013-06-17 LAB — CBC AND DIFFERENTIAL
HEMATOCRIT: 33 % — AB (ref 41–53)
Hemoglobin: 10.9 g/dL — AB (ref 13.5–17.5)
Platelets: 230 10*3/uL (ref 150–399)
WBC: 5 10^3/mL

## 2013-06-17 NOTE — Progress Notes (Signed)
Patient ID: Matthew Floyd, male   DOB: 04/02/1924, 80 y.o.   MRN: [id number]     [facility]  Allergies  Allergen Reactions  . Codeine   . Flecainide     H/o CAD  . Morphine And Related   . Multaq [Dronedarone]   . Penicillins      Chief Complaint  Patient presents with  . Medical Managment of Chronic Issues    HPI:  He is being seen for the management of his chronic illnesses. He has a chronic right heel ulceration which is very slowly improving. The treatment nurse is concerned that he could have osteomyelitis. On 06-09-13 he began diflucan for yeast in his urine.    Past Medical History  Diagnosis Date  . A-fib   . Pacemaker -Biotronik   . CHF (congestive heart failure)     EF 50% on echo 2013, 44% on nuclear stufy 07/2011  . Anemia, pernicious   . Arthritis   . Back pain   . Cerebral aneurysm   . COPD (chronic obstructive pulmonary disease)   . DDD (degenerative disc disease)   . GERD (gastroesophageal reflux disease)     h/o peptic ulcer  . HTN (hypertension)   . Subdural hematoma   . History of skin cancer   . Cigarette smoker   . Valvular disease     MR/TR on echo prev  . Dementia   . MI (myocardial infarction)   . BPH (benign prostatic hyperplasia)   . CAD (coronary artery disease)     s/p stent placement  . Enlarged prostate   . Dementia   . Fall at home   . Ulcer     right heel  . Broken hip   . UTI (urinary tract infection)     Past Surgical History  Procedure Laterality Date  . Finger amputation      traumatic  . Angioplasty    . Hernia repair    . Burr hole for subdural hematoma      with evacuation  . Pacemaker placement      biotronik 10/12/09  . Partial gastrectomy    . Cholecystectomy    . Cardiac catheterization    . Coronary angioplasty    . Insert / replace / remove pacemaker    . Orif right hip    . Hip surgery      VITAL SIGNS BP 121/70  Pulse 60  Ht 6\' 2"  (1.88 m)  Wt 143 lb (64.864 kg)  BMI 18.35  kg/m2   Patient's Medications  New Prescriptions   No medications on file  Previous Medications   ALBUTEROL (PROVENTIL) (2.5 MG/3ML) 0.083% NEBULIZER SOLUTION    Take 3 mLs (2.5 mg total) by nebulization every 6 (six) hours as needed for wheezing or shortness of breath.   COLLAGENASE (SANTYL) OINTMENT    Apply 1 application topically daily.   DIGOXIN (LANOXIN) 0.125 MG TABLET    Take 1 tablet (0.125 mg total) by mouth daily.   DILTIAZEM (CARDIZEM CD) 120 MG 24 HR CAPSULE    Take 1 capsule (120 mg total) by mouth daily.   MEMANTINE HCL ER (NAMENDA XR) 7 MG CP24    Take 2 capsules (14 mg total) by mouth daily.   METHOCARBAMOL (ROBAXIN) 500 MG TABLET    Take 1 tablet (500 mg total) by mouth 2 (two) times daily.   MULTIVITAMIN WITH MINERALS (CERTA-VITE) LIQD    Take 5 mLs by mouth daily.   NAPROXEN SODIUM (ALEVE)  220 MG TABLET    Take 1 tablet (220 mg total) by mouth 2 (two) times daily with a meal.   NICOTINE (NICODERM CQ - DOSED IN MG/24 HOURS) 21 MG/24HR PATCH    Place 21 mg onto the skin daily.   OSTOMY SUPPLIES (SKIN PREP WIPES) MISC    by Does not apply route 2 (two) times daily.   RANITIDINE (ZANTAC) 150 MG TABLET    Take 150 mg by mouth 2 (two) times daily.   RIVAROXABAN (XARELTO) 15 MG TABS TABLET    Take 15 mg by mouth daily with supper.   SENNOSIDES-DOCUSATE SODIUM (SENOKOT-S) 8.6-50 MG TABLET    Take 1 tablet by mouth 2 (two) times daily.   TAMSULOSIN (FLOMAX) 0.4 MG CAPS CAPSULE    Take 1 capsule (0.4 mg total) by mouth daily.   TRAMADOL (ULTRAM) 50 MG TABLET    Take one tablet by mouth every 6 hours as needed for pain 1-5; Take two tablets by mouth every 6 hours as needed for pain 6-10   ZINC OXIDE (BALMEX) 11.3 % CREA CREAM    Apply 1 application topically daily.  Modified Medications   No medications on file  Discontinued Medications   OXYCODONE (OXY-IR) 5 MG CAPSULE    Take 10 mg by mouth every 6 (six) hours as needed.    SIGNIFICANT DIAGNOSTIC EXAMS  04-14-13: chest  x-ray: no active disease; again noted hyperinflation and chronic mild interstitial prominence  04-14-13: right hip x-ray: minimally displaced intertrochanteric right femur fracture  04-18-13: right femur x-ray: a medullary rod and compression screw are now seen. The fracture fragments of the proximal right femur are in anatomic alignment. No acute abnormality seen.   04-28-13: ABI: RIGHT: 0.57; LEFT 0.75     LABS REVIEWED:   04-14-13: wbc 8.7; hgb 13.3; hct 39.9; mcv 94; plt 219; glucose 133; bun 16; creat 1.17; k+2.9; na++140; liver normal albumin 3.1  04-16-13: wbc 9.6; hgb 11.0; hct 32.7; mcv 94 ;plt 145 glucose 81; bun 12; creat 0.84; k+3.6; na++136  04-18-13: wbc 7.6; hgb 11.6; hct 33.0; mcv 93; plt 158; glucose 98; bun 10; creat 1.01; k+3.6;na++ 137 04-23-13:wbc 7.8; hgb 10.1; hct 30.9; mcv 98.1; plt 308; glucose 126; bun 12;creat 1.2; k+3.8;na++137 04-30-13: wbc 7.3; hgb 11.6; hct 34.0; mcv 95.5; plt 509; glucose 79; bun 14; creat 1.0; k+4.3; na++ 138 05-19-13: urine culture: k pneumoniae: levaquin  06-06-13: urine culture: yeast: diflucan     Review of Systems  Unable to perform ROS   Physical Exam  Constitutional: No distress.  frail  Neck: Neck supple. No JVD present.  Cardiovascular: Normal rate and intact distal pulses.   Heart rate irregular   Respiratory: Effort normal and breath sounds normal. No respiratory distress.  02 dependent  GI: Soft. Bowel sounds are normal. He exhibits no distension. There is no tenderness.  Musculoskeletal: He exhibits no edema.  Has limited range of motion to the right lower extremity; is able to move all other extremities Has generalized tenderness on his back without focal area present.    Neurological: He is alert.  Skin: Skin is warm and dry. He is not diaphoretic.  Right heel:has soft slough present proximal to wound bed is reddened area with small area of deep tissue pressure present. The red area has rash area present.       ASSESSMENT/ PLAN:  1. COPD: he is stable; is off the 02; will continue albuterol neb treatment every 6 hours as needed and will  monitor his status.   2. Afib: his heart rate is under control; he remains stable; will continue digoxin 0.125 mg daily and diltiazem cd 120 mg daily for rate control will continue xarelto 15 mg daily will monitor   3. Dementia: no significant change present; will continue namenda xr 14 mg daily and will monitor his status  4. Chronic back pain; is stable will continue aleve twice daily and robaxin 500 mg twice daily; will continue ultram 50 mg 1-2 tabs every 6 hours as needed will monitor   5. BPH: will continue flomax 0.4 mg daily  6. Gerd: will continue zantac 150 mg twice daily  7. Constipation: will continue senna s twice daily  8. Tobacco abuse: will continue nicotine patch 21 mg daily  9. Right heel ulceration: will continue his current treatment at this time; will setup a mri for osteomyelitis and will monitor his status.   10. Hypertension: he remains stable; his toprol was stopped; will continue cardizem cd 120 mg daily and will monitor   Will check cbc next draw.    Synthia Innocent NP [facility]  Contact [phone] Monday through Friday 8am- 5pm  After hours call [phone]

## 2013-07-01 ENCOUNTER — Other Ambulatory Visit: Payer: Self-pay | Admitting: Internal Medicine

## 2013-07-01 DIAGNOSIS — M869 Osteomyelitis, unspecified: Secondary | ICD-10-CM

## 2013-07-02 LAB — BASIC METABOLIC PANEL
BUN: 25 mg/dL — AB (ref 4–21)
CREATININE: 1 mg/dL (ref 0.6–1.3)
Glucose: 72 mg/dL
Potassium: 4.5 mmol/L (ref 3.4–5.3)
SODIUM: 136 mmol/L — AB (ref 137–147)

## 2013-07-03 ENCOUNTER — Ambulatory Visit (HOSPITAL_COMMUNITY)
Admission: RE | Admit: 2013-07-03 | Discharge: 2013-07-03 | Disposition: A | Discharge status: Home / Self Care | Payer: Medicare Other | Source: Ambulatory Visit | Attending: Internal Medicine | Admitting: Internal Medicine

## 2013-07-03 ENCOUNTER — Ambulatory Visit (HOSPITAL_COMMUNITY): Admission: RE | Admit: 2013-07-03 | Payer: Medicare Other | Source: Ambulatory Visit

## 2013-07-03 DIAGNOSIS — M869 Osteomyelitis, unspecified: Secondary | ICD-10-CM

## 2013-07-03 DIAGNOSIS — Z950 Presence of cardiac pacemaker: Secondary | ICD-10-CM | POA: Insufficient documentation

## 2013-07-03 MED ORDER — IOHEXOL 300 MG/ML  SOLN
80.0000 mL | Freq: Once | INTRAMUSCULAR | Status: AC | PRN
  Administered 2013-07-03: 80 mL via INTRAVENOUS

## 2013-07-04 ENCOUNTER — Non-Acute Institutional Stay (SKILLED_NURSING_FACILITY): Payer: Medicare Other | Admitting: Adult Health

## 2013-07-04 DIAGNOSIS — L89610 Pressure ulcer of right heel, unstageable: Secondary | ICD-10-CM

## 2013-07-04 DIAGNOSIS — L8995 Pressure ulcer of unspecified site, unstageable: Secondary | ICD-10-CM

## 2013-07-04 DIAGNOSIS — M86179 Other acute osteomyelitis, unspecified ankle and foot: Secondary | ICD-10-CM

## 2013-07-04 DIAGNOSIS — L89609 Pressure ulcer of unspecified heel, unspecified stage: Secondary | ICD-10-CM

## 2013-07-04 DIAGNOSIS — M86171 Other acute osteomyelitis, right ankle and foot: Secondary | ICD-10-CM

## 2013-07-07 ENCOUNTER — Other Ambulatory Visit: Payer: Self-pay | Admitting: Internal Medicine

## 2013-07-07 DIAGNOSIS — M898X9 Other specified disorders of bone, unspecified site: Secondary | ICD-10-CM

## 2013-07-07 DIAGNOSIS — L97509 Non-pressure chronic ulcer of other part of unspecified foot with unspecified severity: Secondary | ICD-10-CM

## 2013-07-07 DIAGNOSIS — M869 Osteomyelitis, unspecified: Secondary | ICD-10-CM

## 2013-07-09 ENCOUNTER — Non-Acute Institutional Stay (SKILLED_NURSING_FACILITY): Payer: Medicare Other | Admitting: Adult Health

## 2013-07-09 ENCOUNTER — Telehealth: Payer: Self-pay

## 2013-07-09 ENCOUNTER — Encounter: Payer: Self-pay | Admitting: Adult Health

## 2013-07-09 DIAGNOSIS — L89609 Pressure ulcer of unspecified heel, unspecified stage: Secondary | ICD-10-CM

## 2013-07-09 DIAGNOSIS — L89610 Pressure ulcer of right heel, unstageable: Secondary | ICD-10-CM

## 2013-07-09 DIAGNOSIS — L8995 Pressure ulcer of unspecified site, unstageable: Secondary | ICD-10-CM

## 2013-07-09 NOTE — Telephone Encounter (Signed)
Dr.Pandey received a call on her cell phone requesting an order for Bone Density, the phone was passed to me to handle request. I informed caller I will place order.  After reviewing the chart I see that this is a nursing home patient. I called [facility] @ [phone] and left message for the director of nursing to further follow-up on this request for patient is not a patient here at [facility].

## 2013-07-09 NOTE — Progress Notes (Signed)
Patient ID: Matthew RavelFred Floyd, male   DOB: 1923-12-28, 80 y.o.   MRN: [id number]     [facility]  Allergies  Allergen Reactions  . Codeine   . Flecainide     H/o CAD  . Morphine And Related   . Multaq [Dronedarone]   . Penicillins      Chief Complaint  Patient presents with  . Acute Visit    wound management     HPI:  He is being seen for wound management for the wounds on his right heel and left great toe. There is concern that he may developing osteomyelitis in his right foot. The wound is deep; there is slough present. His pain is being managed at this time.    Past Medical History  Diagnosis Date  . A-fib   . Pacemaker -Biotronik   . CHF (congestive heart failure)     EF 50% on echo 2013, 44% on nuclear stufy 07/2011  . Anemia, pernicious   . Arthritis   . Back pain   . Cerebral aneurysm   . COPD (chronic obstructive pulmonary disease)   . DDD (degenerative disc disease)   . GERD (gastroesophageal reflux disease)     h/o peptic ulcer  . HTN (hypertension)   . Subdural hematoma   . History of skin cancer   . Cigarette smoker   . Valvular disease     MR/TR on echo prev  . Dementia   . MI (myocardial infarction)   . BPH (benign prostatic hyperplasia)   . CAD (coronary artery disease)     s/p stent placement  . Enlarged prostate   . Dementia   . Fall at home   . Ulcer     right heel  . Broken hip   . UTI (urinary tract infection)     Past Surgical History  Procedure Laterality Date  . Finger amputation      traumatic  . Angioplasty    . Hernia repair    . Burr hole for subdural hematoma      with evacuation  . Pacemaker placement      biotronik 10/12/09  . Partial gastrectomy    . Cholecystectomy    . Cardiac catheterization    . Coronary angioplasty    . Insert / replace / remove pacemaker    . Orif right hip    . Hip surgery      VITAL SIGNS BP 117/68  Pulse 80  Ht 6\' 2"  (1.88 m)  Wt 142 lb 12.8 oz (64.774 kg)  BMI 18.33  kg/m2   Patient's Medications  New Prescriptions   No medications on file  Previous Medications   ALBUTEROL (PROVENTIL) (2.5 MG/3ML) 0.083% NEBULIZER SOLUTION    Take 3 mLs (2.5 mg total) by nebulization every 6 (six) hours as needed for wheezing or shortness of breath.   COLLAGENASE (SANTYL) OINTMENT    Apply 1 application topically daily.   DIGOXIN (LANOXIN) 0.125 MG TABLET    Take 1 tablet (0.125 mg total) by mouth daily.   DILTIAZEM (CARDIZEM CD) 120 MG 24 HR CAPSULE    Take 1 capsule (120 mg total) by mouth daily.   MELATONIN 3 MG TABS    Take 3 mg by mouth at bedtime.   MEMANTINE HCL ER (NAMENDA XR) 7 MG CP24    Take 2 capsules (14 mg total) by mouth daily.   METHOCARBAMOL (ROBAXIN) 500 MG TABLET    Take 1 tablet (500 mg total) by mouth 2 (  two) times daily.   MULTIVITAMIN WITH MINERALS (CERTA-VITE) LIQD    Take 5 mLs by mouth daily.   NAPROXEN SODIUM (ALEVE) 220 MG TABLET    Take 1 tablet (220 mg total) by mouth 2 (two) times daily with a meal.   NICOTINE (NICODERM CQ - DOSED IN MG/24 HOURS) 21 MG/24HR PATCH    Place 21 mg onto the skin daily.   OSTOMY SUPPLIES (SKIN PREP WIPES) MISC    by Does not apply route 2 (two) times daily.   RANITIDINE (ZANTAC) 150 MG TABLET    Take 150 mg by mouth 2 (two) times daily.   RIVAROXABAN (XARELTO) 15 MG TABS TABLET    Take 15 mg by mouth daily with supper.   SENNOSIDES-DOCUSATE SODIUM (SENOKOT-S) 8.6-50 MG TABLET    Take 1 tablet by mouth 2 (two) times daily.   TAMSULOSIN (FLOMAX) 0.4 MG CAPS CAPSULE    Take 1 capsule (0.4 mg total) by mouth daily.   TRAMADOL (ULTRAM) 50 MG TABLET    Take one tablet by mouth every 6 hours as needed for pain 1-5; Take two tablets by mouth every 6 hours as needed for pain 6-10   ZINC OXIDE (BALMEX) 11.3 % CREA CREAM    Apply 1 application topically daily.  Modified Medications   No medications on file  Discontinued Medications   No medications on file    SIGNIFICANT DIAGNOSTIC EXAMS  04-14-13: chest x-ray: no  active disease; again noted hyperinflation and chronic mild interstitial prominence  04-14-13: right hip x-ray: minimally displaced intertrochanteric right femur fracture  04-18-13: right femur x-ray: a medullary rod and compression screw are now seen. The fracture fragments of the proximal right femur are in anatomic alignment. No acute abnormality seen.   04-28-13: ABI: RIGHT: 0.57; LEFT 0.75   07-03-13: right foot ct scan: 1. Subcutaneous edema along the lateral ankle and adjacent to the fifth metatarsophalangeal joint may reflect cellulitis, but without bony destructive findings characteristic of osteomyelitis. Three-phase bone scan can provide complementary information if clinically warranted. MRI is contraindicated by the patient's pacemaker. 2. Well corticated medial erosions along the head of the first metatarsal could reflect gout arthropathy.     LABS REVIEWED:   04-14-13: wbc 8.7; hgb 13.3; hct 39.9; mcv 94; plt 219; glucose 133; bun 16; creat 1.17; k+2.9; na++140; liver normal albumin 3.1  04-16-13: wbc 9.6; hgb 11.0; hct 32.7; mcv 94 ;plt 145 glucose 81; bun 12; creat 0.84; k+3.6; na++136  04-18-13: wbc 7.6; hgb 11.6; hct 33.0; mcv 93; plt 158; glucose 98; bun 10; creat 1.01; k+3.6;na++ 137 04-23-13:wbc 7.8; hgb 10.1; hct 30.9; mcv 98.1; plt 308; glucose 126; bun 12;creat 1.2; k+3.8;na++137 04-30-13: wbc 7.3; hgb 11.6; hct 34.0; mcv 95.5; plt 509; glucose 79; bun 14; creat 1.0; k+4.3; na++ 138 05-19-13: urine culture: k pneumoniae: levaquin  06-06-13: urine culture: yeast: diflucan  06-17-13: wbc 5.0; hgb10.9; hct 33.3; mcv 88.3. ;plt 230 07-02-13: glucose 72; bun 25; creat 1.0; k+4.5; na++136     Review of Systems  Unable to perform ROS   Physical Exam  Constitutional: No distress.  frail  Neck: Neck supple. No JVD present.  Cardiovascular: Normal rate and intact distal pulses.   Heart rate irregular   Respiratory: Effort normal and breath sounds normal. No respiratory  distress.  02 dependent  GI: Soft. Bowel sounds are normal. He exhibits no distension. There is no tenderness.  Musculoskeletal: He exhibits no edema.  Has limited range of motion to the right  lower extremity; is able to move all other extremities Has generalized tenderness on his back without focal area present.    Neurological: He is alert.  Skin: Skin is warm and dry. He is not diaphoretic.  Right heel distal wound stage III: 2.5 x 3.0 x 0.2 cm   50% slough and 50% granulating tissue present.  Right heel proximal wound stage III: 2.6 x 1.5 x 0.2 cm  50% slough and 50% granulating tissue present.  Left great toe : 0.5 x 0.7 cm 100% eschar present.    ASSESSMENT/ PLAN:  1. Right heel/left great toe wounds: with concerns for osteomyelitis; will begin cipro 750 mh daily for 4 weeks with florastor twice daily for 4 weeks. Will setup a bone scan for his right foot and heel and will continue to monitor his status; will continue his current wound treatments.       Synthia Innocent NP [facility]  Contact [phone] Monday through Friday 8am- 5pm  After hours call [phone]

## 2013-07-10 ENCOUNTER — Ambulatory Visit (HOSPITAL_COMMUNITY): Admission: RE | Admit: 2013-07-10 | Payer: Medicare Other | Source: Ambulatory Visit

## 2013-07-10 ENCOUNTER — Other Ambulatory Visit: Payer: Self-pay

## 2013-07-10 ENCOUNTER — Non-Acute Institutional Stay (SKILLED_NURSING_FACILITY): Payer: Medicare Other | Admitting: Adult Health

## 2013-07-10 DIAGNOSIS — N179 Acute kidney failure, unspecified: Secondary | ICD-10-CM

## 2013-07-10 DIAGNOSIS — J441 Chronic obstructive pulmonary disease with (acute) exacerbation: Secondary | ICD-10-CM

## 2013-07-10 DIAGNOSIS — J189 Pneumonia, unspecified organism: Secondary | ICD-10-CM

## 2013-07-10 LAB — BASIC METABOLIC PANEL
ANION GAP: 8 (ref 7–16)
BUN: 32 mg/dL — ABNORMAL HIGH (ref 7–18)
CO2: 23 mmol/L (ref 21–32)
CREATININE: 1.81 mg/dL — AB (ref 0.60–1.30)
Calcium, Total: 8.5 mg/dL (ref 8.5–10.1)
Chloride: 106 mmol/L (ref 98–107)
EGFR (Non-African Amer.): 33 — ABNORMAL LOW
GFR CALC AF AMER: 38 — AB
Glucose: 242 mg/dL — ABNORMAL HIGH (ref 65–99)
OSMOLALITY: 289 (ref 275–301)
Potassium: 4.4 mmol/L (ref 3.5–5.1)
Sodium: 137 mmol/L (ref 136–145)

## 2013-07-11 ENCOUNTER — Other Ambulatory Visit: Payer: Self-pay | Admitting: Internal Medicine

## 2013-07-11 ENCOUNTER — Ambulatory Visit (HOSPITAL_COMMUNITY)
Admission: RE | Admit: 2013-07-11 | Discharge: 2013-07-11 | Disposition: A | Discharge status: Home / Self Care | Payer: Medicare Other | Source: Ambulatory Visit | Attending: Internal Medicine | Admitting: Internal Medicine

## 2013-07-11 DIAGNOSIS — M898X9 Other specified disorders of bone, unspecified site: Secondary | ICD-10-CM

## 2013-07-11 DIAGNOSIS — L97509 Non-pressure chronic ulcer of other part of unspecified foot with unspecified severity: Secondary | ICD-10-CM

## 2013-07-11 DIAGNOSIS — Z13820 Encounter for screening for osteoporosis: Secondary | ICD-10-CM | POA: Insufficient documentation

## 2013-07-12 ENCOUNTER — Inpatient Hospital Stay (HOSPITAL_COMMUNITY)
Admission: EM | Admit: 2013-07-12 | Discharge: 2013-07-23 | DRG: 871 | Disposition: E | Payer: Medicare Other | Attending: Internal Medicine | Admitting: Internal Medicine

## 2013-07-12 ENCOUNTER — Encounter (HOSPITAL_COMMUNITY): Payer: Self-pay | Admitting: Emergency Medicine

## 2013-07-12 ENCOUNTER — Inpatient Hospital Stay (HOSPITAL_COMMUNITY): Payer: Medicare Other

## 2013-07-12 ENCOUNTER — Emergency Department (HOSPITAL_COMMUNITY): Payer: Medicare Other

## 2013-07-12 DIAGNOSIS — L89610 Pressure ulcer of right heel, unstageable: Secondary | ICD-10-CM | POA: Diagnosis present

## 2013-07-12 DIAGNOSIS — Z903 Acquired absence of stomach [part of]: Secondary | ICD-10-CM

## 2013-07-12 DIAGNOSIS — Z87891 Personal history of nicotine dependence: Secondary | ICD-10-CM

## 2013-07-12 DIAGNOSIS — Z8249 Family history of ischemic heart disease and other diseases of the circulatory system: Secondary | ICD-10-CM

## 2013-07-12 DIAGNOSIS — J4489 Other specified chronic obstructive pulmonary disease: Secondary | ICD-10-CM | POA: Diagnosis present

## 2013-07-12 DIAGNOSIS — IMO0002 Reserved for concepts with insufficient information to code with codable children: Secondary | ICD-10-CM | POA: Diagnosis not present

## 2013-07-12 DIAGNOSIS — R451 Restlessness and agitation: Secondary | ICD-10-CM

## 2013-07-12 DIAGNOSIS — I4729 Other ventricular tachycardia: Secondary | ICD-10-CM | POA: Diagnosis present

## 2013-07-12 DIAGNOSIS — F0280 Dementia in other diseases classified elsewhere without behavioral disturbance: Secondary | ICD-10-CM

## 2013-07-12 DIAGNOSIS — R6520 Severe sepsis without septic shock: Secondary | ICD-10-CM

## 2013-07-12 DIAGNOSIS — Z681 Body mass index (BMI) 19 or less, adult: Secondary | ICD-10-CM

## 2013-07-12 DIAGNOSIS — E440 Moderate protein-calorie malnutrition: Secondary | ICD-10-CM | POA: Diagnosis present

## 2013-07-12 DIAGNOSIS — N179 Acute kidney failure, unspecified: Secondary | ICD-10-CM

## 2013-07-12 DIAGNOSIS — J40 Bronchitis, not specified as acute or chronic: Secondary | ICD-10-CM

## 2013-07-12 DIAGNOSIS — I2510 Atherosclerotic heart disease of native coronary artery without angina pectoris: Secondary | ICD-10-CM

## 2013-07-12 DIAGNOSIS — J449 Chronic obstructive pulmonary disease, unspecified: Secondary | ICD-10-CM

## 2013-07-12 DIAGNOSIS — N400 Enlarged prostate without lower urinary tract symptoms: Secondary | ICD-10-CM | POA: Diagnosis present

## 2013-07-12 DIAGNOSIS — K219 Gastro-esophageal reflux disease without esophagitis: Secondary | ICD-10-CM | POA: Diagnosis present

## 2013-07-12 DIAGNOSIS — I10 Essential (primary) hypertension: Secondary | ICD-10-CM | POA: Diagnosis present

## 2013-07-12 DIAGNOSIS — Z7901 Long term (current) use of anticoagulants: Secondary | ICD-10-CM

## 2013-07-12 DIAGNOSIS — Z993 Dependence on wheelchair: Secondary | ICD-10-CM

## 2013-07-12 DIAGNOSIS — Z515 Encounter for palliative care: Secondary | ICD-10-CM

## 2013-07-12 DIAGNOSIS — I472 Ventricular tachycardia, unspecified: Secondary | ICD-10-CM | POA: Diagnosis present

## 2013-07-12 DIAGNOSIS — F411 Generalized anxiety disorder: Secondary | ICD-10-CM | POA: Diagnosis not present

## 2013-07-12 DIAGNOSIS — L8995 Pressure ulcer of unspecified site, unstageable: Secondary | ICD-10-CM | POA: Diagnosis present

## 2013-07-12 DIAGNOSIS — I4891 Unspecified atrial fibrillation: Secondary | ICD-10-CM

## 2013-07-12 DIAGNOSIS — A419 Sepsis, unspecified organism: Secondary | ICD-10-CM | POA: Diagnosis present

## 2013-07-12 DIAGNOSIS — M62838 Other muscle spasm: Secondary | ICD-10-CM | POA: Diagnosis not present

## 2013-07-12 DIAGNOSIS — Z66 Do not resuscitate: Secondary | ICD-10-CM

## 2013-07-12 DIAGNOSIS — J189 Pneumonia, unspecified organism: Secondary | ICD-10-CM

## 2013-07-12 DIAGNOSIS — F0390 Unspecified dementia without behavioral disturbance: Secondary | ICD-10-CM

## 2013-07-12 DIAGNOSIS — I509 Heart failure, unspecified: Secondary | ICD-10-CM | POA: Diagnosis present

## 2013-07-12 DIAGNOSIS — R569 Unspecified convulsions: Secondary | ICD-10-CM | POA: Diagnosis not present

## 2013-07-12 DIAGNOSIS — G9340 Encephalopathy, unspecified: Secondary | ICD-10-CM | POA: Diagnosis present

## 2013-07-12 DIAGNOSIS — E860 Dehydration: Secondary | ICD-10-CM

## 2013-07-12 DIAGNOSIS — N390 Urinary tract infection, site not specified: Secondary | ICD-10-CM

## 2013-07-12 DIAGNOSIS — Z85828 Personal history of other malignant neoplasm of skin: Secondary | ICD-10-CM

## 2013-07-12 DIAGNOSIS — R443 Hallucinations, unspecified: Secondary | ICD-10-CM | POA: Diagnosis present

## 2013-07-12 DIAGNOSIS — J9600 Acute respiratory failure, unspecified whether with hypoxia or hypercapnia: Secondary | ICD-10-CM | POA: Diagnosis present

## 2013-07-12 DIAGNOSIS — Z950 Presence of cardiac pacemaker: Secondary | ICD-10-CM

## 2013-07-12 DIAGNOSIS — Z79899 Other long term (current) drug therapy: Secondary | ICD-10-CM

## 2013-07-12 DIAGNOSIS — L89609 Pressure ulcer of unspecified heel, unspecified stage: Secondary | ICD-10-CM | POA: Diagnosis present

## 2013-07-12 DIAGNOSIS — I252 Old myocardial infarction: Secondary | ICD-10-CM

## 2013-07-12 DIAGNOSIS — L8993 Pressure ulcer of unspecified site, stage 3: Secondary | ICD-10-CM | POA: Diagnosis present

## 2013-07-12 DIAGNOSIS — B377 Candidal sepsis: Secondary | ICD-10-CM

## 2013-07-12 DIAGNOSIS — S68118A Complete traumatic metacarpophalangeal amputation of other finger, initial encounter: Secondary | ICD-10-CM

## 2013-07-12 DIAGNOSIS — Z8711 Personal history of peptic ulcer disease: Secondary | ICD-10-CM

## 2013-07-12 DIAGNOSIS — G309 Alzheimer's disease, unspecified: Secondary | ICD-10-CM | POA: Diagnosis present

## 2013-07-12 DIAGNOSIS — R4182 Altered mental status, unspecified: Secondary | ICD-10-CM

## 2013-07-12 DIAGNOSIS — Z880 Allergy status to penicillin: Secondary | ICD-10-CM

## 2013-07-12 LAB — URINALYSIS, ROUTINE W REFLEX MICROSCOPIC
Glucose, UA: NEGATIVE mg/dL
Ketones, ur: NEGATIVE mg/dL
NITRITE: NEGATIVE
Protein, ur: 30 mg/dL — AB
SPECIFIC GRAVITY, URINE: 1.021 (ref 1.005–1.030)
UROBILINOGEN UA: 1 mg/dL (ref 0.0–1.0)
pH: 5 (ref 5.0–8.0)

## 2013-07-12 LAB — CBC WITH DIFFERENTIAL/PLATELET
BASOS ABS: 0 10*3/uL (ref 0.0–0.1)
BASOS PCT: 0 % (ref 0–1)
EOS ABS: 0 10*3/uL (ref 0.0–0.7)
Eosinophils Relative: 1 % (ref 0–5)
HCT: 34.4 % — ABNORMAL LOW (ref 39.0–52.0)
Hemoglobin: 11.7 g/dL — ABNORMAL LOW (ref 13.0–17.0)
Lymphocytes Relative: 11 % — ABNORMAL LOW (ref 12–46)
Lymphs Abs: 0.5 10*3/uL — ABNORMAL LOW (ref 0.7–4.0)
MCH: 28.5 pg (ref 26.0–34.0)
MCHC: 34 g/dL (ref 30.0–36.0)
MCV: 83.9 fL (ref 78.0–100.0)
Monocytes Absolute: 0.3 10*3/uL (ref 0.1–1.0)
Monocytes Relative: 7 % (ref 3–12)
NEUTROS PCT: 82 % — AB (ref 43–77)
Neutro Abs: 4.2 10*3/uL (ref 1.7–7.7)
PLATELETS: 164 10*3/uL (ref 150–400)
RBC: 4.1 MIL/uL — AB (ref 4.22–5.81)
RDW: 16 % — AB (ref 11.5–15.5)
WBC: 5.1 10*3/uL (ref 4.0–10.5)

## 2013-07-12 LAB — BASIC METABOLIC PANEL
BUN: 54 mg/dL — ABNORMAL HIGH (ref 6–23)
CO2: 20 mEq/L (ref 19–32)
Calcium: 7.9 mg/dL — ABNORMAL LOW (ref 8.4–10.5)
Chloride: 95 mEq/L — ABNORMAL LOW (ref 96–112)
Creatinine, Ser: 1.64 mg/dL — ABNORMAL HIGH (ref 0.50–1.35)
GFR, EST AFRICAN AMERICAN: 41 mL/min — AB (ref >90)
GFR, EST NON AFRICAN AMERICAN: 36 mL/min — AB (ref >90)
GLUCOSE: 88 mg/dL (ref 70–99)
POTASSIUM: 4.5 meq/L (ref 3.7–5.3)
SODIUM: 130 meq/L — AB (ref 137–147)

## 2013-07-12 LAB — URINE MICROSCOPIC-ADD ON

## 2013-07-12 LAB — I-STAT CG4 LACTIC ACID, ED: Lactic Acid, Venous: 3.03 mmol/L — ABNORMAL HIGH (ref 0.5–2.2)

## 2013-07-12 MED ORDER — FAMOTIDINE 20 MG PO TABS
20.0000 mg | ORAL_TABLET | Freq: Two times a day (BID) | ORAL | Status: DC
  Administered 2013-07-13 – 2013-07-14 (×4): 20 mg via ORAL
  Filled 2013-07-12 (×5): qty 1

## 2013-07-12 MED ORDER — ACETAMINOPHEN 650 MG RE SUPP
650.0000 mg | RECTAL | Status: DC | PRN
  Administered 2013-07-12: 650 mg via RECTAL
  Filled 2013-07-12: qty 1

## 2013-07-12 MED ORDER — NICOTINE 21 MG/24HR TD PT24
21.0000 mg | MEDICATED_PATCH | Freq: Every day | TRANSDERMAL | Status: DC
  Administered 2013-07-13 – 2013-07-15 (×4): 21 mg via TRANSDERMAL
  Filled 2013-07-12 (×5): qty 1

## 2013-07-12 MED ORDER — COLLAGENASE 250 UNIT/GM EX OINT
1.0000 "application " | TOPICAL_OINTMENT | Freq: Every day | CUTANEOUS | Status: DC
  Administered 2013-07-13 – 2013-07-17 (×4): 1 via TOPICAL
  Filled 2013-07-12: qty 30

## 2013-07-12 MED ORDER — METHOCARBAMOL 500 MG PO TABS
500.0000 mg | ORAL_TABLET | Freq: Two times a day (BID) | ORAL | Status: DC
  Administered 2013-07-13 (×2): 500 mg via ORAL
  Filled 2013-07-12 (×3): qty 1

## 2013-07-12 MED ORDER — SODIUM CHLORIDE 0.9 % IJ SOLN
3.0000 mL | Freq: Two times a day (BID) | INTRAMUSCULAR | Status: DC
  Administered 2013-07-13 (×2): 3 mL via INTRAVENOUS

## 2013-07-12 MED ORDER — ONDANSETRON HCL 4 MG PO TABS: 4.0000 mg | ORAL_TABLET | Freq: Four times a day (QID) | ORAL | Status: DC | PRN

## 2013-07-12 MED ORDER — IPRATROPIUM BROMIDE 0.02 % IN SOLN: 0.5000 mg | Freq: Four times a day (QID) | RESPIRATORY_TRACT | Status: DC

## 2013-07-12 MED ORDER — GUAIFENESIN ER 600 MG PO TB12
600.0000 mg | ORAL_TABLET | Freq: Two times a day (BID) | ORAL | Status: DC
  Administered 2013-07-13 – 2013-07-14 (×4): 600 mg via ORAL
  Filled 2013-07-12 (×7): qty 1

## 2013-07-12 MED ORDER — LEVOFLOXACIN IN D5W 500 MG/100ML IV SOLN: 500.0000 mg | INTRAVENOUS | Status: DC

## 2013-07-12 MED ORDER — RIVAROXABAN 15 MG PO TABS
15.0000 mg | ORAL_TABLET | Freq: Every day | ORAL | Status: DC
  Administered 2013-07-13: 15 mg via ORAL
  Filled 2013-07-12 (×4): qty 1

## 2013-07-12 MED ORDER — TRAMADOL HCL 50 MG PO TABS: 50.0000 mg | ORAL_TABLET | Freq: Four times a day (QID) | ORAL | Status: DC | PRN

## 2013-07-12 MED ORDER — ALBUTEROL SULFATE (2.5 MG/3ML) 0.083% IN NEBU: 2.5000 mg | INHALATION_SOLUTION | RESPIRATORY_TRACT | Status: DC | PRN

## 2013-07-12 MED ORDER — ACETAMINOPHEN 325 MG PO TABS
650.0000 mg | ORAL_TABLET | Freq: Four times a day (QID) | ORAL | Status: DC | PRN
Start: 2013-07-12 — End: 2013-07-16

## 2013-07-12 MED ORDER — LEVOFLOXACIN IN D5W 750 MG/150ML IV SOLN
750.0000 mg | INTRAVENOUS | Status: AC
  Administered 2013-07-12: 750 mg via INTRAVENOUS
  Filled 2013-07-12: qty 150

## 2013-07-12 MED ORDER — HYDROCODONE-ACETAMINOPHEN 5-325 MG PO TABS: 1.0000 | ORAL_TABLET | ORAL | Status: DC | PRN

## 2013-07-12 MED ORDER — ALBUTEROL SULFATE (2.5 MG/3ML) 0.083% IN NEBU: 2.5000 mg | INHALATION_SOLUTION | RESPIRATORY_TRACT | Status: DC

## 2013-07-12 MED ORDER — ACETAMINOPHEN 325 MG PO TABS: 650.0000 mg | ORAL_TABLET | Freq: Four times a day (QID) | ORAL | Status: DC | PRN

## 2013-07-12 MED ORDER — MEMANTINE HCL ER 7 MG PO CP24: 14.0000 mg | ORAL_CAPSULE | Freq: Every day | ORAL | Status: DC

## 2013-07-12 MED ORDER — ONDANSETRON HCL 4 MG/2ML IJ SOLN: 4.0000 mg | Freq: Four times a day (QID) | INTRAMUSCULAR | Status: DC | PRN

## 2013-07-12 MED ORDER — MEMANTINE HCL 5 MG PO TABS
5.0000 mg | ORAL_TABLET | Freq: Two times a day (BID) | ORAL | Status: DC
  Administered 2013-07-13 – 2013-07-14 (×3): 5 mg via ORAL
  Filled 2013-07-12 (×7): qty 1

## 2013-07-12 MED ORDER — ACETAMINOPHEN 650 MG RE SUPP: 650.0000 mg | Freq: Four times a day (QID) | RECTAL | Status: DC | PRN

## 2013-07-12 MED ORDER — SENNOSIDES-DOCUSATE SODIUM 8.6-50 MG PO TABS
1.0000 | ORAL_TABLET | Freq: Two times a day (BID) | ORAL | Status: DC
  Administered 2013-07-13 – 2013-07-14 (×3): 1 via ORAL
  Filled 2013-07-12 (×4): qty 1

## 2013-07-12 MED ORDER — TAMSULOSIN HCL 0.4 MG PO CAPS
0.4000 mg | ORAL_CAPSULE | Freq: Every day | ORAL | Status: DC
  Administered 2013-07-13 – 2013-07-14 (×2): 0.4 mg via ORAL
  Filled 2013-07-12 (×4): qty 1

## 2013-07-12 MED ORDER — SODIUM CHLORIDE 0.9 % IV SOLN
INTRAVENOUS | Status: DC
  Administered 2013-07-13: 30 mL/h via INTRAVENOUS
  Administered 2013-07-13: 100 mL/h via INTRAVENOUS
  Administered 2013-07-13: via INTRAVENOUS

## 2013-07-12 NOTE — H&P (Signed)
[facility] History and Physical  Matthew RavelFred Fish RUE:[id number] DOB: [birth year] DOA: 07/05/2013  Referring physician: Dr. Effie ShyWentz, ER physician PCP: Oneal GroutPANDEY, [name], MD   Chief Complaint: confusion  HPI: Matthew Floyd is a 80 y.o. male with multiple medical problems [facility].  Patient's family reports that for the last 2 days, he has been increasingly confused.  They have also noted that his po intake has been poor and he has occasionally vomited.  No clear documented fever. They also report that he has had worsening shortness of breath.  He has a productive cough of clear sputum, which has changed from his baseline.  They have noticed decreased urine output as well.  He has a chronic non healing wound on his right foot.  No diarrhea, abdominal pain. He was brought to the ED where chest xray indicated possible pneumonia, urinalysis indicated possible infection, his BUN and creatinine were elevated.  He has been referred for admission.   Review of Systems:  Unable to assess due to confusion  Past Medical History  Diagnosis Date  . A-fib   . Pacemaker -Biotronik   . CHF (congestive heart failure)     EF 50% on echo 2013, 44% on nuclear stufy 07/2011  . Anemia, pernicious   . Arthritis   . Back pain   . Cerebral aneurysm   . COPD (chronic obstructive pulmonary disease)   . DDD (degenerative disc disease)   . GERD (gastroesophageal reflux disease)     h/o peptic ulcer  . HTN (hypertension)   . Subdural hematoma   . History of skin cancer   . Cigarette smoker   . Valvular disease     MR/TR on echo prev  . Dementia   . MI (myocardial infarction)   . BPH (benign prostatic hyperplasia)   . CAD (coronary artery disease)     s/p stent placement  . Enlarged prostate   . Dementia   . Fall at home   . Ulcer     right heel  . Broken hip   . UTI (urinary tract infection)    Past Surgical History  Procedure Laterality Date  . Finger amputation      traumatic  .  Angioplasty    . Hernia repair    . Burr hole for subdural hematoma      with evacuation  . Pacemaker placement      biotronik 10/12/09  . Partial gastrectomy    . Cholecystectomy    . Cardiac catheterization    . Coronary angioplasty    . Insert / replace / remove pacemaker    . Orif right hip    . Hip surgery     Social History:  reports that he has quit smoking. His smoking use included Cigarettes. He started smoking about 61 years ago. He has a 32.5 pack-year smoking history. He has never used smokeless tobacco. He reports that he does not drink alcohol or use illicit drugs.  Allergies  Allergen Reactions  . Flecainide Other (See Comments)    H/o CAD  . Codeine Other (See Comments)    Per MAR  . Morphine And Related Other (See Comments)    Per MAR  . Multaq [Dronedarone] Other (See Comments)    Per MAR  . Penicillins Other (See Comments)    Family History  Problem Relation Age of Onset  . Dementia Mother   . Heart disease Father   . Heart disease Brother  Prior to Admission medications   Medication Sig Start Date End Date Taking? Authorizing Provider  albuterol (PROVENTIL) (2.5 MG/3ML) 0.083% nebulizer solution Take 3 mLs (2.5 mg total) by nebulization every 6 (six) hours as needed for wheezing or shortness of breath. 04/22/13  Yes Sharee Holster, NP  collagenase (SANTYL) ointment Apply 1 application topically daily.   Yes Historical Provider, MD  digoxin (LANOXIN) 0.125 MG tablet Take 1 tablet (0.125 mg total) by mouth daily. 02/04/13  Yes Joaquim Nam, MD  diltiazem (CARDIZEM CD) 120 MG 24 hr capsule Take 1 capsule (120 mg total) by mouth daily. 02/04/13  Yes Joaquim Nam, MD  Melatonin 3 MG TABS Take 3 mg by mouth at bedtime.   Yes Historical Provider, MD  Memantine HCl ER (NAMENDA XR) 7 MG CP24 Take 2 capsules (14 mg total) by mouth daily. 05/26/13  Yes Sharee Holster, NP  methocarbamol (ROBAXIN) 500 MG tablet Take 1 tablet (500 mg total) by mouth 2 (two)  times daily. 06/03/13  Yes Sharee Holster, NP  multivitamin with minerals (CERTA-VITE) LIQD Take 5 mLs by mouth daily.   Yes Historical Provider, MD  nicotine (NICODERM CQ - DOSED IN MG/24 HOURS) 21 mg/24hr patch Place 21 mg onto the skin daily.   Yes Historical Provider, MD  ranitidine (ZANTAC) 150 MG tablet Take 150 mg by mouth 2 (two) times daily. 02/04/13  Yes Joaquim Nam, MD  Rivaroxaban (XARELTO) 15 MG TABS tablet Take 15 mg by mouth daily with supper.   Yes Historical Provider, MD  sennosides-docusate sodium (SENOKOT-S) 8.6-50 MG tablet Take 1 tablet by mouth 2 (two) times daily.   Yes Historical Provider, MD  tamsulosin (FLOMAX) 0.4 MG CAPS capsule Take 1 capsule (0.4 mg total) by mouth daily. 02/04/13  Yes Joaquim Nam, MD  traMADol (ULTRAM) 50 MG tablet Take one tablet by mouth every 6 hours as needed for pain 1-5; Take two tablets by mouth every 6 hours as needed for pain 6-10 04/29/13  Yes Kimber Relic, MD  traMADol (ULTRAM) 50 MG tablet Take 50 mg by mouth every 6 (six) hours as needed for moderate pain.   Yes Historical Provider, MD   Physical Exam: Filed Vitals:   20-Jul-2013 1930  BP: 110/67  Pulse: 83  Temp:   Resp: 18    BP 110/67  Pulse 83  Temp(Src) 98.4 F (36.9 C) (Rectal)  Resp 18  Ht 6' (1.829 m)  Wt 63.504 kg (140 lb)  BMI 18.98 kg/m2  SpO2 97%  General:  Appears calm and comfortable, confused Eyes: PERRL, normal lids, irises & conjunctiva ENT: dry mucous membranes Neck: no LAD, masses or thyromegaly Cardiovascular: S1, S2 irregular, trace LE edema Respiratory: diminished breath sounds bilaterally, otherwise clear, mildly increased work of breathing. Abdomen: soft, nt, nd, bs+ Skin: unstageable ulcer noted on right medial heel Musculoskeletal: grossly normal tone BUE/BLE Psychiatric: confused, thinks he is at home Neurologic: grossly non-focal.          Labs on Admission:  Basic Metabolic Panel:  Recent Labs Lab July 20, 2013 1705  NA 130*  K  4.5  CL 95*  CO2 20  GLUCOSE 88  BUN 54*  CREATININE 1.64*  CALCIUM 7.9*   Liver Function Tests: No results found for this basename: AST, ALT, ALKPHOS, BILITOT, PROT, ALBUMIN,  in the last 168 hours No results found for this basename: LIPASE, AMYLASE,  in the last 168 hours No results found for this basename: AMMONIA,  in  the last 168 hours CBC:  Recent Labs Lab 2013/07/26 1705  WBC 5.1  NEUTROABS 4.2  HGB 11.7*  HCT 34.4*  MCV 83.9  PLT 164   Cardiac Enzymes: No results found for this basename: CKTOTAL, CKMB, CKMBINDEX, TROPONINI,  in the last 168 hours  BNP (last 3 results) No results found for this basename: PROBNP,  in the last 8760 hours CBG: No results found for this basename: GLUCAP,  in the last 168 hours  Radiological Exams on Admission: Dg Bone Density Peripheral Skeleton  07/11/2013   : The Bone Mineral Densitometry hard-copy report (which includes all data, graphical display, and FRAX results when applicable) has been sent directly to the ordering physician.  This report can also be obtained electronically by viewing images for this exam through the performing facility's EMR, or by logging directly into [employer].  Please insert correct template.   Electronically Signed   By: Christiana Pellant M.D.   On: 07/11/2013 14:38   Dg Bone Density  07/11/2013   : The Bone Mineral Densitometry hard-copy report (which includes all data, graphical display, and FRAX results when applicable) has been sent directly to the ordering physician.  This report can also be obtained electronically by viewing images for this exam through the performing facility's EMR, or by logging directly into [employer].  Please insert correct template.   Electronically Signed   By: Christiana Pellant M.D.   On: 07/11/2013 14:38   Dg Chest Port 1 View  (if Code Sepsis Called)  07-26-13   CLINICAL DATA:  Altered mental status, history CHF, COPD, hypertension, coronary artery disease post MI, former smoker   EXAM: PORTABLE CHEST - 1 VIEW  COMPARISON:  Portable exam 1644 hr compared to 04/14/2013  FINDINGS: Left subclavian transvenous pacemaker leads project over right atrium and right ventricle.  Enlargement of cardiac silhouette.  Atherosclerotic calcification of a tortuous thoracic aorta.  Mediastinal contours and pulmonary vascularity otherwise normal.  COPD changes with new mild perihilar interstitial infiltrates greater on right question representing asymmetric edema though it is difficult to exclude consolidation in medial right lower lobe.  No gross pleural effusion or pneumothorax.  Bones demineralized.  IMPRESSION: COPD changes with increased right perihilar and basilar markings could represent asymmetric edema though difficult to exclude right basilar pneumonia.   Electronically Signed   By: Ulyses Southward M.D.   On: 26-Jul-2013 17:22    EKG: not done in ED, this has been ordered.  Assessment/Plan Active Problems:   AF (atrial fibrillation)   COPD (chronic obstructive pulmonary disease)   Essential hypertension, benign   Pressure ulcer, heel, right, unstageable   AKI (acute kidney injury)   UTI (urinary tract infection)   Encephalopathy acute   Healthcare-associated pneumonia   Dehydration   Pneumonia   1. Pneumonia, health care acquired vs. Aspiration.  Will start the patient on broad spectrum antibiotics vancomycin and aztreonam since he is penicillin allergic. Will request a swallow evaluation. 2. UTI.  Follow up urine culture.  Continue current antibiotics 3. RLE wound.  Will continue on antibiotics.  Wound care consult.  Will request 3 phase bone scan to evaluate for underlying osteomyelitis, since patient has pacemaker and cannot get an MRI 4. Acute encephalopathy.  Likely related to above processes.  Continue to monitor. 5. AKI.  Likely related to dehydration and decreased po intake. Will hydrate with IV fluids and recheck in AM.  Check renal ultrasound. 6. COPD.  No evidence of  wheezing. Continue neb  treatments. 7. Atrial fibrillation.  Hold cardizem in light of soft blood pressures.  Will hold digoxin in light of renal failure and check digoxin level.  These medications can be resumed as tolerated. He is anticoagulated with Xarelto  Code Status: DNR, confirmed with family Family Communication: discussed with son and multiple family members at the bedside Disposition Plan: return to [facility] on discharge  Time spent:  [facility] Pager [phone]

## 2013-07-12 NOTE — Progress Notes (Signed)
Received report from Janelle, RN

## 2013-07-12 NOTE — ED Notes (Signed)
Dinner tray ordered for pt per request

## 2013-07-12 NOTE — ED Notes (Signed)
Blood clot noted in urinal when pt provided a urine sample.

## 2013-07-12 NOTE — ED Notes (Signed)
Pt had change in mental status on Thursday . Pt was treated with IV fluids while at [facility]. Pt also has a productive cough ( yellow mucous). Pt also has wounds on bilateral feet. EMS placed PT on a 100% mask for comfort . Pt A on arrival to D.

## 2013-07-12 NOTE — ED Provider Notes (Signed)
CSN: [id number]     Arrival date & time Aug 08, 2013  1559 History   First MD Initiated Contact with Patient 0Apr 17, 2015 1610     Chief Complaint  Patient presents with  . Altered Mental Status     (Consider location/radiation/quality/duration/timing/severity/associated sxs/prior Treatment) Patient is a 80 y.o. male presenting with altered mental status. The history is provided by the patient.  Altered Mental Status  He presents for evaluation of shortness of breath. This is been progressive over several days and not responding to medications, given his nursing care facility. The patient cannot give history. According to the patient's son; he is weaker than usual, less active than usual, and not eating like he usually does.  Level 5 caveat: Dementia  Past Medical History  Diagnosis Date  . A-fib   . Pacemaker -Biotronik   . CHF (congestive heart failure)     EF 50% on echo 2013, 44% on nuclear stufy 07/2011  . Anemia, pernicious   . Arthritis   . Back pain   . Cerebral aneurysm   . COPD (chronic obstructive pulmonary disease)   . DDD (degenerative disc disease)   . GERD (gastroesophageal reflux disease)     h/o peptic ulcer  . HTN (hypertension)   . Subdural hematoma   . History of skin cancer   . Cigarette smoker   . Valvular disease     MR/TR on echo prev  . Dementia   . MI (myocardial infarction)   . BPH (benign prostatic hyperplasia)   . CAD (coronary artery disease)     s/p stent placement  . Enlarged prostate   . Dementia   . Fall at home   . Ulcer     right heel  . Broken hip   . UTI (urinary tract infection)    Past Surgical History  Procedure Laterality Date  . Finger amputation      traumatic  . Angioplasty    . Hernia repair    . Burr hole for subdural hematoma      with evacuation  . Pacemaker placement      biotronik 10/12/09  . Partial gastrectomy    . Cholecystectomy    . Cardiac catheterization    . Coronary angioplasty    . Insert / replace /  remove pacemaker    . Orif right hip    . Hip surgery     Family History  Problem Relation Age of Onset  . Dementia Mother   . Heart disease Father   . Heart disease Brother    History  Substance Use Topics  . Smoking status: Former Smoker -- 0.50 packs/day for 65 years    Types: Cigarettes    Start date: 04/24/1952  . Smokeless tobacco: Never Used  . Alcohol Use: No    Review of Systems  Unable to perform ROS     Allergies  Flecainide; Codeine; Morphine and related; Multaq; and Penicillins  Home Medications   No current outpatient prescriptions on file. BP 109/68  Pulse 92  Temp(Src) 97.4 F (36.3 C) (Oral)  Resp 18  Ht 6' (1.829 m)  Wt 149 lb 11.1 oz (67.9 kg)  BMI 20.30 kg/m2  SpO2 97% Physical Exam  Nursing note and vitals reviewed. Constitutional: He is oriented to person, place, and time. He appears well-developed.  Elderly, frail  HENT:  Head: Normocephalic.  Right Ear: External ear normal.  Left Ear: External ear normal.  Eyes: Conjunctivae and EOM are normal. Pupils are equal,  round, and reactive to light.  Neck: Normal range of motion and phonation normal. Neck supple.  Cardiovascular: Normal rate, regular rhythm, normal heart sounds and intact distal pulses.   Pulmonary/Chest: No respiratory distress. He has no wheezes. He has no rales. He exhibits no bony tenderness.  Decreased air movement, left greater than right  Abdominal: Soft. Normal appearance. There is no tenderness.  Musculoskeletal: Normal range of motion. He exhibits no edema and no tenderness.  Neurological: He is alert and oriented to person, place, and time. No cranial nerve deficit or sensory deficit. He exhibits normal muscle tone. Coordination normal.  Skin: Skin is warm, dry and intact.  Psychiatric: He has a normal mood and affect. His behavior is normal.    ED Course  Procedures (including critical care time)  During initial examination, his face mask oxygen, was removed,  and he maintained a normal oxygen saturation for at least 10 minutes   Medications  traMADol (ULTRAM) tablet 50 mg (not administered)  methocarbamol (ROBAXIN) tablet 500 mg (not administered)  collagenase (SANTYL) ointment 1 application (not administered)  nicotine (NICODERM CQ - dosed in mg/24 hours) patch 21 mg (not administered)  Rivaroxaban (XARELTO) tablet 15 mg (not administered)  famotidine (PEPCID) tablet 20 mg (not administered)  senna-docusate (Senokot-S) tablet 1 tablet (not administered)  tamsulosin (FLOMAX) capsule 0.4 mg (not administered)  sodium chloride 0.9 % injection 3 mL (not administered)  albuterol (PROVENTIL) (2.5 MG/3ML) 0.083% nebulizer solution 2.5 mg (not administered)  albuterol (PROVENTIL) (2.5 MG/3ML) 0.083% nebulizer solution 2.5 mg (not administered)  ipratropium (ATROVENT) nebulizer solution 0.5 mg (not administered)  guaiFENesin (MUCINEX) 12 hr tablet 600 mg (not administered)  ondansetron (ZOFRAN) tablet 4 mg (not administered)    Or  ondansetron (ZOFRAN) injection 4 mg (not administered)  acetaminophen (TYLENOL) tablet 650 mg (not administered)    Or  acetaminophen (TYLENOL) suppository 650 mg (not administered)  HYDROcodone-acetaminophen (NORCO/VICODIN) 5-325 MG per tablet 1-2 tablet (not administered)  0.9 %  sodium chloride infusion ( Intravenous New Bag/Given 07/13/13 0014)  memantine (NAMENDA) tablet 5 mg (not administered)  vancomycin (VANCOCIN) 1,250 mg in sodium chloride 0.9 % 250 mL IVPB (not administered)  vancomycin (VANCOCIN) IVPB 1000 mg/200 mL premix (not administered)  aztreonam (AZACTAM) 1 g in dextrose 5 % 50 mL IVPB (not administered)  levofloxacin (LEVAQUIN) IVPB 750 mg (750 mg Intravenous New Bag/Given 06/27/2013 2011)    Patient Vitals for the past 24 hrs:  BP Temp Temp src Pulse Resp SpO2 Height Weight  07/13/13 0010 109/68 mmHg 97.4 F (36.3 C) Oral 92 18 97 % 6' (1.829 m) 149 lb 11.1 oz (67.9 kg)  07/19/2013 1930 110/67 mmHg -  - 83 18 97 % - -  07/01/2013 1915 118/77 mmHg - - 80 12 91 % - -  07/18/2013 1845 104/56 mmHg - - 98 25 96 % - -  07/09/2013 1830 112/73 mmHg - - 94 18 100 % - -  06/25/2013 1815 117/94 mmHg - - 45 25 92 % - -  06/27/2013 1800 112/83 mmHg - - 105 23 100 % - -  07/05/2013 1745 120/65 mmHg - - 91 17 96 % - -  07/19/2013 1730 126/72 mmHg - - 91 23 98 % - -  06/30/2013 1715 115/72 mmHg - - 51 24 97 % - -  07/09/2013 1700 127/60 mmHg - - 96 17 96 % - -  06/24/2013 1645 113/64 mmHg - - 94 21 96 % - -  06/23/2013 1618 113/75  mmHg 98.4 F (36.9 C) Rectal - 20 96 % 6' (1.829 m) 140 lb (63.504 kg)  07/04/2013 1615 113/75 mmHg - - - - - - -  06/28/2013 1603 - - - - - 99 % - -    9:19 PM Reevaluation with update and discussion. After initial assessment and treatment, an updated evaluation reveals he attempted to eat, but vomited. He continues to be weak. Findings discussed with patient and family members, all questions answered.Mancel Bale L   2230 Consult complete with Dr. Avie Arenas. Patient case explained and discussed. He agrees to admit patient for further evaluation and treatment. Call ended at [phone]    Labs Review Labs Reviewed  URINALYSIS, ROUTINE W REFLEX MICROSCOPIC - Abnormal; Notable for the following:    Color, Urine AMBER (*)    APPearance CLOUDY (*)    Hgb urine dipstick LARGE (*)    Bilirubin Urine SMALL (*)    Protein, ur 30 (*)    Leukocytes, UA LARGE (*)    All other components within normal limits  CBC WITH DIFFERENTIAL - Abnormal; Notable for the following:    RBC 4.10 (*)    Hemoglobin 11.7 (*)    HCT 34.4 (*)    RDW 16.0 (*)    Neutrophils Relative % 82 (*)    Lymphocytes Relative 11 (*)    Lymphs Abs 0.5 (*)    All other components within normal limits  BASIC METABOLIC PANEL - Abnormal; Notable for the following:    Sodium 130 (*)    Chloride 95 (*)    BUN 54 (*)    Creatinine, Ser 1.64 (*)    Calcium 7.9 (*)    GFR calc non Af Amer 36 (*)    GFR calc Af Amer 41 (*)    All other  components within normal limits  URINE MICROSCOPIC-ADD ON - Abnormal; Notable for the following:    Squamous Epithelial / LPF FEW (*)    All other components within normal limits  I-STAT CG4 LACTIC ACID, ED - Abnormal; Notable for the following:    Lactic Acid, Venous 3.03 (*)    All other components within normal limits  CULTURE, BLOOD (ROUTINE X 2)  CULTURE, BLOOD (ROUTINE X 2)  URINE CULTURE  DIGOXIN LEVEL  COMPREHENSIVE METABOLIC PANEL  CBC  LACTIC ACID, PLASMA   Imaging Review Dg Bone Density Peripheral Skeleton  07/11/2013   : The Bone Mineral Densitometry hard-copy report (which includes all data, graphical display, and FRAX results when applicable) has been sent directly to the ordering physician.  This report can also be obtained electronically by viewing images for this exam through the performing facility's EMR, or by logging directly into [employer].  Please insert correct template.   Electronically Signed   By: Christiana Pellant M.D.   On: 07/11/2013 14:38   Dg Bone Density  07/11/2013   : The Bone Mineral Densitometry hard-copy report (which includes all data, graphical display, and FRAX results when applicable) has been sent directly to the ordering physician.  This report can also be obtained electronically by viewing images for this exam through the performing facility's EMR, or by logging directly into [employer].  Please insert correct template.   Electronically Signed   By: Christiana Pellant M.D.   On: 07/11/2013 14:38   Dg Chest Port 1 View  (if Code Sepsis Called)  07/02/2013   CLINICAL DATA:  Altered mental status, history CHF, COPD, hypertension, coronary artery disease post MI, former  smoker  EXAM: PORTABLE CHEST - 1 VIEW  COMPARISON:  Portable exam 1644 hr compared to 04/14/2013  FINDINGS: Left subclavian transvenous pacemaker leads project over right atrium and right ventricle.  Enlargement of cardiac silhouette.  Atherosclerotic calcification of a tortuous thoracic  aorta.  Mediastinal contours and pulmonary vascularity otherwise normal.  COPD changes with new mild perihilar interstitial infiltrates greater on right question representing asymmetric edema though it is difficult to exclude consolidation in medial right lower lobe.  No gross pleural effusion or pneumothorax.  Bones demineralized.  IMPRESSION: COPD changes with increased right perihilar and basilar markings could represent asymmetric edema though difficult to exclude right basilar pneumonia.   Electronically Signed   By: Ulyses Southward M.D.   On: 25-Jul-2013 17:22   Dg Abd Portable 1v  July 25, 2013   CLINICAL DATA:  Nausea and vomiting. Bilateral flank pain. Prior gastrectomy for ulcer disease.  EXAM: PORTABLE ABDOMEN - 1 VIEW  COMPARISON:  CT abdomen and pelvis 03/12/2013.  FINDINGS: Bowel gas pattern unremarkable without evidence of obstruction or significant ileus. Large stool burden in the colon. Surgical clips in a right upper quadrant from prior cholecystectomy. Severe degenerative changes throughout the lumbar spine. Aortoiliofemoral atherosclerosis.  IMPRESSION: No acute abdominal abnormality.  Large stool burden.   Electronically Signed   By: Hulan Saas M.D.   On: 07/25/13 23:18     EKG Interpretation None      MDM   Final diagnoses:  Bronchitis  Dehydration  Acute kidney injury  UTI (lower urinary tract infection)    Weakness with decreased appetite, and ongoing respiratory complaints, likely bronchitis, however, cannot rule out pneumonia, obscured by dehydration status. Likely urinary tract infection as well. Family members state that this is recurrent.  He will require admission for stabilization.  Nursing Notes Reviewed/ Care Coordinated, and agree without changes. Applicable Imaging Reviewed.  Interpretation of Laboratory Data incorporated into ED treatment  Plan: Admit    Flint Melter, MD 07/13/13 [phone]

## 2013-07-12 NOTE — ED Notes (Signed)
In and out attempt unsuccessful, resistance encountered, patient tolerated poorly

## 2013-07-13 ENCOUNTER — Encounter (HOSPITAL_COMMUNITY): Payer: Self-pay | Admitting: General Practice

## 2013-07-13 ENCOUNTER — Inpatient Hospital Stay (HOSPITAL_COMMUNITY): Payer: Medicare Other

## 2013-07-13 DIAGNOSIS — J40 Bronchitis, not specified as acute or chronic: Secondary | ICD-10-CM

## 2013-07-13 DIAGNOSIS — IMO0002 Reserved for concepts with insufficient information to code with codable children: Secondary | ICD-10-CM

## 2013-07-13 LAB — COMPREHENSIVE METABOLIC PANEL
ALBUMIN: 2.2 g/dL — AB (ref 3.5–5.2)
ALK PHOS: 177 U/L — AB (ref 39–117)
ALT: 24 U/L (ref 0–53)
AST: 32 U/L (ref 0–37)
BUN: 51 mg/dL — AB (ref 6–23)
CO2: 19 mEq/L (ref 19–32)
Calcium: 7.7 mg/dL — ABNORMAL LOW (ref 8.4–10.5)
Chloride: 100 mEq/L (ref 96–112)
Creatinine, Ser: 1.5 mg/dL — ABNORMAL HIGH (ref 0.50–1.35)
GFR calc Af Amer: 46 mL/min — ABNORMAL LOW (ref >90)
GFR calc non Af Amer: 40 mL/min — ABNORMAL LOW (ref >90)
Glucose, Bld: 93 mg/dL (ref 70–99)
Potassium: 4.2 mEq/L (ref 3.7–5.3)
Sodium: 133 mEq/L — ABNORMAL LOW (ref 137–147)
Total Bilirubin: 0.5 mg/dL (ref 0.3–1.2)
Total Protein: 4.8 g/dL — ABNORMAL LOW (ref 6.0–8.3)

## 2013-07-13 LAB — URINE CULTURE

## 2013-07-13 LAB — CBC
HCT: 31.8 % — ABNORMAL LOW (ref 39.0–52.0)
HEMATOCRIT: 34.4 % — AB (ref 39.0–52.0)
HEMOGLOBIN: 12.1 g/dL — AB (ref 13.0–17.0)
Hemoglobin: 11.1 g/dL — ABNORMAL LOW (ref 13.0–17.0)
MCH: 29 pg (ref 26.0–34.0)
MCH: 29.2 pg (ref 26.0–34.0)
MCHC: 34.9 g/dL (ref 30.0–36.0)
MCHC: 35.2 g/dL (ref 30.0–36.0)
MCV: 83 fL (ref 78.0–100.0)
MCV: 82.9 fL (ref 78.0–100.0)
PLATELETS: 149 10*3/uL — AB (ref 150–400)
Platelets: 142 10*3/uL — ABNORMAL LOW (ref 150–400)
RBC: 3.83 MIL/uL — ABNORMAL LOW (ref 4.22–5.81)
RBC: 4.15 MIL/uL — ABNORMAL LOW (ref 4.22–5.81)
RDW: 16 % — AB (ref 11.5–15.5)
RDW: 15.9 % — ABNORMAL HIGH (ref 11.5–15.5)
WBC: 3.7 10*3/uL — AB (ref 4.0–10.5)
WBC: 5.1 10*3/uL (ref 4.0–10.5)

## 2013-07-13 LAB — DIGOXIN LEVEL: Digoxin Level: 1 ng/mL (ref 0.8–2.0)

## 2013-07-13 LAB — LACTIC ACID, PLASMA: LACTIC ACID, VENOUS: 1.7 mmol/L (ref 0.5–2.2)

## 2013-07-13 MED ORDER — VANCOMYCIN HCL 10 G IV SOLR
1250.0000 mg | Freq: Once | INTRAVENOUS | Status: AC
  Administered 2013-07-13: 1250 mg via INTRAVENOUS
  Filled 2013-07-13: qty 1250

## 2013-07-13 MED ORDER — HALOPERIDOL LACTATE 5 MG/ML IJ SOLN: 2.0000 mg | Freq: Four times a day (QID) | INTRAMUSCULAR | Status: DC | PRN

## 2013-07-13 MED ORDER — VANCOMYCIN HCL IN DEXTROSE 1-5 GM/200ML-% IV SOLN: 1000.0000 mg | INTRAVENOUS | Status: DC

## 2013-07-13 MED ORDER — IPRATROPIUM-ALBUTEROL 0.5-2.5 (3) MG/3ML IN SOLN
3.0000 mL | Freq: Four times a day (QID) | RESPIRATORY_TRACT | Status: DC
  Administered 2013-07-13 – 2013-07-14 (×4): 3 mL via RESPIRATORY_TRACT
  Filled 2013-07-13 (×4): qty 3

## 2013-07-13 MED ORDER — LORAZEPAM 2 MG/ML IJ SOLN
1.0000 mg | Freq: Three times a day (TID) | INTRAMUSCULAR | Status: DC | PRN
  Administered 2013-07-14 (×2): 1 mg via INTRAMUSCULAR
  Filled 2013-07-13 (×2): qty 1

## 2013-07-13 MED ORDER — SENNOSIDES-DOCUSATE SODIUM 8.6-50 MG PO TABS: 1.0000 | ORAL_TABLET | Freq: Two times a day (BID) | ORAL | Status: DC

## 2013-07-13 MED ORDER — METOPROLOL TARTRATE 1 MG/ML IV SOLN
5.0000 mg | Freq: Four times a day (QID) | INTRAVENOUS | Status: DC | PRN
  Administered 2013-07-13 – 2013-07-14 (×2): 5 mg via INTRAVENOUS
  Filled 2013-07-13 (×2): qty 5

## 2013-07-13 MED ORDER — METHOCARBAMOL 500 MG PO TABS
500.0000 mg | ORAL_TABLET | Freq: Two times a day (BID) | ORAL | Status: DC | PRN
  Filled 2013-07-13: qty 1

## 2013-07-13 MED ORDER — IPRATROPIUM-ALBUTEROL 0.5-2.5 (3) MG/3ML IN SOLN
3.0000 mL | Freq: Two times a day (BID) | RESPIRATORY_TRACT | Status: DC
  Administered 2013-07-13: 3 mL via RESPIRATORY_TRACT
  Filled 2013-07-13: qty 3

## 2013-07-13 MED ORDER — DEXTROSE 5 % IV SOLN
1.0000 g | Freq: Three times a day (TID) | INTRAVENOUS | Status: DC
  Administered 2013-07-13 – 2013-07-14 (×5): 1 g via INTRAVENOUS
  Filled 2013-07-13 (×7): qty 1

## 2013-07-13 MED ORDER — HALOPERIDOL LACTATE 5 MG/ML IJ SOLN
2.0000 mg | Freq: Four times a day (QID) | INTRAMUSCULAR | Status: DC
  Administered 2013-07-13 – 2013-07-17 (×15): 2 mg via INTRAVENOUS
  Filled 2013-07-13 (×2): qty 0.4
  Filled 2013-07-13: qty 1
  Filled 2013-07-13 (×3): qty 0.4
  Filled 2013-07-13: qty 1
  Filled 2013-07-13 (×3): qty 0.4
  Filled 2013-07-13: qty 1
  Filled 2013-07-13 (×4): qty 0.4
  Filled 2013-07-13: qty 1
  Filled 2013-07-13 (×3): qty 0.4
  Filled 2013-07-13 (×2): qty 1
  Filled 2013-07-13: qty 0.4
  Filled 2013-07-13: qty 1

## 2013-07-13 MED ORDER — LORAZEPAM 1 MG PO TABS
1.0000 mg | ORAL_TABLET | Freq: Three times a day (TID) | ORAL | Status: DC | PRN
  Administered 2013-07-13: 1 mg via ORAL
  Filled 2013-07-13: qty 1

## 2013-07-13 NOTE — Consult Note (Signed)
WOC wound consult note Reason for Consult: Chronic, non-healing LE wounds Wound type: Pressure (right heel and left posterior calf), trauma (left hallux) Pressure Ulcer POA: Yes Measurement:Left great toe (hallux):  1.5cm x 1cm with depth undetermined due to the presence of a soft yellow center. Posterior left calf:  1cm round with no appreciable depth.  Reepithelialization in progress.  Right heel: Stage III chronic, non-healing pressure ulcer: 3cm x 2cm x 0.2cm with smooth wound bed indicative of stalled wound healing. Wound bed: As described below. Drainage (amount, consistency, odor) None from left posterior calf. Scant, light yellow, No odor, from left great toe (hallux) and right heel). Periwound: Intact. Dressing procedure/placement/frequency: A Geomat pressure redistribution chair pad is provided for OOB use in chair and guidelines are provided for turning and repositioning while in bed.  I will continue the pressure redistribution boots employed by the LTC facility, but change the silicone foam dressings to the right heel and left great toe (hallux) to saline twice daily in an attempt to jump-start these chronic wounds and clean them up a bit.  The left posterior calf medical device related pressure ulcer (MDRPU) is nearly healed and is now partial thickness.  I will continue to protect this area with a soft silicone foam dressing while here and until healed. Patient favors the supine position in bed and in the presence of his visiting family, he is instructed to lie side to side and to use the (provided) chair cushion when OOB in chair. WOC nursing team will not follow, but will remain available to this patient, the nursing and medical team.  Please re-consult if needed. Thanks, Ladona MowLaurie Bridget Westbrooks, MSN, RN, GNP, ViloniaWOCN, CWON-AP [phone])

## 2013-07-13 NOTE — Progress Notes (Signed)
Patient ID: Matthew RavelFred Floyd, male   DOB: 1923-06-23, 80 y.o.   MRN: [id number] [facility] PROGRESS NOTE  Matthew RavelFred Floyd [id number] DOB: 1923-06-23 DOA: November 19, 2013 PCP: Oneal GroutPANDEY, [name], MD  Brief narrative: 80 y.o. male with COPD, atrial fibrillation on Xarelto, HTN, pressure heal ulcer, who resides at [facility], [facility] after noted to be more confused and with poor oral intake, dyspnea with exertion and at rest also noted, with productive cough of yellow sputum. In ED, CXR worrisome for PNA and [facility] asked to admit for further evaluation.   Active Problems:   Acute hypoxic respiratory failure - appears to be secondary to PNA, treating as HCAP as pt resides at SNF - clinically stable this AM and maintaining oxygen saturations at target range - continue Vancomycin and Aztreonam day #2 - continue BD's scheduled and as needed   AF (atrial fibrillation) - rate controlled - continue Xarelto    COPD (chronic obstructive pulmonary disease) - clinically compensated, maintaining oxygen saturations at target range - continue BD's, ABX as noted above    Essential hypertension, benign - reasonable inpatient control    Pressure ulcer, heel, right, unstageable - wound care consult    AKI (acute kidney injury) - likley pre renal from dehydration and poor oral intake - Cr is trending down - BMP in AM   UTI (urinary tract infection) - current ABX should be adequate in coverage - follow up on urine culture    Moderate malnutrition - secondary to acute on chronic illness - advance diet as pt able to tolerate   Consultants:  None  Procedures/Studies: CXR  November 19, 2013   COPD changes with increased right perihilar and basilar markings could represent asymmetric edema though difficult to exclude right basilar PNA.   Dg Abd Portable  November 19, 2013  No acute abdominal abnormality.  Large stool burden.     Antibiotics:  Vancomycin 3/21 -->  Aztreonam 3/21 -->  Code Status: DNR Family  Communication: Pt at bedside Disposition Plan: SNF when stable   HPI/Subjective: No events overnight.   Objective: Filed Vitals:   04/30/13 1930 07/13/13 0010 07/13/13 0538 07/13/13 0741  BP: 110/67 109/68 112/66   Pulse: 83 92 84   Temp:  97.4 F (36.3 C) 97.2 F (36.2 C)   TempSrc:  Oral Oral   Resp: 18 18 18    Height:  6' (1.829 m)    Weight:  67.9 kg (149 lb 11.1 oz)    SpO2: 97% 97% 98% 96%    Intake/Output Summary (Last 24 hours) at 07/13/13 1421 Last data filed at 07/13/13 0911  Gross per 24 hour  Intake    163 ml  Output    800 ml  Net   -637 ml    Exam:   General:  Pt is alert, follows commands appropriately, not in acute distress, HOH  Cardiovascular: Regular rate and rhythm, no rubs, no gallops  Respiratory: Bibasilar rhonchi, no wheezing   Abdomen: Soft, non tender, non distended, bowel sounds present, no guarding  Extremities: No edema, pulses DP and PT palpable bilaterally  Data Reviewed: Basic Metabolic Panel:  Recent Labs Lab 04/30/13 1705 07/13/13 0140  NA 130* 133*  K 4.5 4.2  CL 95* 100  CO2 20 19  GLUCOSE 88 93  BUN 54* 51*  CREATININE 1.64* 1.50*  CALCIUM 7.9* 7.7*   Liver Function Tests:  Recent Labs Lab 07/13/13 0140  AST 32  ALT 24  ALKPHOS 177*  BILITOT 0.5  PROT 4.8*  ALBUMIN 2.2*   CBC:  Recent Labs Lab 06/27/2013 1705 07/13/13 0140  WBC 5.1 3.7*  NEUTROABS 4.2  --   HGB 11.7* 11.1*  HCT 34.4* 31.8*  MCV 83.9 83.0  PLT 164 149*   Scheduled Meds: . aztreonam  1 g Intravenous Q8H  . famotidine  20 mg Oral BID  . guaiFENesin  600 mg Oral BID  . ipratropium-albuterol  3 mL Nebulization BID  . memantine  5 mg Oral BID  . methocarbamol  500 mg Oral BID  . Rivaroxaban  15 mg Oral Q supper  . senna-docusate  1 tablet Oral BID  . tamsulosin  0.4 mg Oral Daily  . vancomycin  1,000 mg Intravenous Q48H   Continuous Infusions: . sodium chloride 100 mL/hr (07/13/13 1326)   Debbora Presto, MD  [facility] Pager  [phone]  If 7PM-7AM, please contact night-coverage [email or web address] Password [email or web address] 07/13/2013, 2:21 PM   LOS: 1 day

## 2013-07-13 NOTE — Progress Notes (Signed)
ANTIBIOTIC CONSULT NOTE - INITIAL  Pharmacy Consult for Vancomycin and Aztreonam Indication: rule out pneumonia  Allergies  Allergen Reactions  . Flecainide Other (See Comments)    H/o CAD  . Codeine Other (See Comments)    Per MAR  . Morphine And Related Other (See Comments)    Per MAR  . Multaq [Dronedarone] Other (See Comments)    Per MAR  . Penicillins Other (See Comments)    Patient Measurements: Height: 6' (182.9 cm) Weight: 140 lb (63.504 kg) IBW/kg (Calculated) : 77.6  Vital Signs: Temp: 98.4 F (36.9 C) (03/21 1618) Temp src: Rectal (03/21 1618) BP: 110/67 mmHg (03/21 1930) Pulse Rate: 83 (03/21 1930) Intake/Output from previous day:   Intake/Output from this shift:    Labs:  Recent Labs  06/22/2013 1705  WBC 5.1  HGB 11.7*  PLT 164  CREATININE 1.64*   Estimated Creatinine Clearance: 28 ml/min (by C-G formula based on Cr of 1.64). No results found for this basename: VANCOTROUGH, VANCOPEAK, VANCORANDOM, GENTTROUGH, GENTPEAK, GENTRANDOM, TOBRATROUGH, TOBRAPEAK, TOBRARND, AMIKACINPEAK, AMIKACINTROU, AMIKACIN,  in the last 72 hours   Microbiology: No results found for this or any previous visit (from the past 720 hour(s)).  Medical History: Past Medical History  Diagnosis Date  . A-fib   . Pacemaker -Biotronik   . CHF (congestive heart failure)     EF 50% on echo 2013, 44% on nuclear stufy 07/2011  . Anemia, pernicious   . Arthritis   . Back pain   . Cerebral aneurysm   . COPD (chronic obstructive pulmonary disease)   . DDD (degenerative disc disease)   . GERD (gastroesophageal reflux disease)     h/o peptic ulcer  . HTN (hypertension)   . Subdural hematoma   . History of skin cancer   . Cigarette smoker   . Valvular disease     MR/TR on echo prev  . Dementia   . MI (myocardial infarction)   . BPH (benign prostatic hyperplasia)   . CAD (coronary artery disease)     s/p stent placement  . Enlarged prostate   . Dementia   . Fall at home    . Ulcer     right heel  . Broken hip   . UTI (urinary tract infection)     Medications:  Prescriptions prior to admission  Medication Sig Dispense Refill  . albuterol (PROVENTIL) (2.5 MG/3ML) 0.083% nebulizer solution Take 3 mLs (2.5 mg total) by nebulization every 6 (six) hours as needed for wheezing or shortness of breath.  75 mL  12  . collagenase (SANTYL) ointment Apply 1 application topically daily.      . digoxin (LANOXIN) 0.125 MG tablet Take 1 tablet (0.125 mg total) by mouth daily.  30 tablet  12  . diltiazem (CARDIZEM CD) 120 MG 24 hr capsule Take 1 capsule (120 mg total) by mouth daily.  30 capsule  12  . Melatonin 3 MG TABS Take 3 mg by mouth at bedtime.      . Memantine HCl ER (NAMENDA XR) 7 MG CP24 Take 2 capsules (14 mg total) by mouth daily.  30 capsule  11  . methocarbamol (ROBAXIN) 500 MG tablet Take 1 tablet (500 mg total) by mouth 2 (two) times daily.  60 tablet  11  . multivitamin with minerals (CERTA-VITE) LIQD Take 5 mLs by mouth daily.      . nicotine (NICODERM CQ - DOSED IN MG/24 HOURS) 21 mg/24hr patch Place 21 mg onto the skin daily.      .Marland Kitchen  ranitidine (ZANTAC) 150 MG tablet Take 150 mg by mouth 2 (two) times daily.      . Rivaroxaban (XARELTO) 15 MG TABS tablet Take 15 mg by mouth daily with supper.      . sennosides-docusate sodium (SENOKOT-S) 8.6-50 MG tablet Take 1 tablet by mouth 2 (two) times daily.      . tamsulosin (FLOMAX) 0.4 MG CAPS capsule Take 1 capsule (0.4 mg total) by mouth daily.  30 capsule  12  . traMADol (ULTRAM) 50 MG tablet Take one tablet by mouth every 6 hours as needed for pain 1-5; Take two tablets by mouth every 6 hours as needed for pain 6-10  360 tablet  5  . traMADol (ULTRAM) 50 MG tablet Take 50 mg by mouth every 6 (six) hours as needed for moderate pain.       Assessment: 80 yo male with SOB, possible PNA for empiric antibiotics  Goal of Therapy:  Vancomycin trough level 15-20 mcg/ml  Plan:  Vancomycin 1250 mg IV now, then 1  g IV q48h Aztreonam 1 g q8h  Matthew Floyd, Matthew Floyd 07/13/2013,12:04 AM

## 2013-07-13 NOTE — Progress Notes (Signed)
Pt arrived to unit alert and oriented x2. Upon admission assessment, stage 1 pressure ulcer noted to bottom. Foam dressing placed. Two wounds to right heel, foam dressing placed and Una boots. Oriented to room, unit, and staff.  Bed in lowest position and call bell is within reach. Will continue to monitor.

## 2013-07-14 ENCOUNTER — Encounter (HOSPITAL_COMMUNITY): Payer: Medicare Other

## 2013-07-14 ENCOUNTER — Inpatient Hospital Stay (HOSPITAL_COMMUNITY): Payer: Medicare Other

## 2013-07-14 ENCOUNTER — Encounter (HOSPITAL_COMMUNITY): Payer: Self-pay | Admitting: Radiology

## 2013-07-14 DIAGNOSIS — B377 Candidal sepsis: Secondary | ICD-10-CM

## 2013-07-14 LAB — COMPREHENSIVE METABOLIC PANEL
ALK PHOS: 212 U/L — AB (ref 39–117)
ALT: 21 U/L (ref 0–53)
AST: 27 U/L (ref 0–37)
Albumin: 2.3 g/dL — ABNORMAL LOW (ref 3.5–5.2)
BUN: 36 mg/dL — AB (ref 6–23)
CO2: 21 meq/L (ref 19–32)
Calcium: 7.9 mg/dL — ABNORMAL LOW (ref 8.4–10.5)
Chloride: 103 mEq/L (ref 96–112)
Creatinine, Ser: 1.13 mg/dL (ref 0.50–1.35)
GFR, EST AFRICAN AMERICAN: 65 mL/min — AB (ref >90)
GFR, EST NON AFRICAN AMERICAN: 56 mL/min — AB (ref >90)
GLUCOSE: 80 mg/dL (ref 70–99)
POTASSIUM: 3.8 meq/L (ref 3.7–5.3)
Sodium: 136 mEq/L — ABNORMAL LOW (ref 137–147)
TOTAL PROTEIN: 5 g/dL — AB (ref 6.0–8.3)
Total Bilirubin: 0.5 mg/dL (ref 0.3–1.2)

## 2013-07-14 LAB — CBC WITH DIFFERENTIAL/PLATELET
BASOS ABS: 0 10*3/uL (ref 0.0–0.1)
Basophils Relative: 1 % (ref 0–1)
EOS ABS: 0.1 10*3/uL (ref 0.0–0.7)
Eosinophils Relative: 1 % (ref 0–5)
HCT: 32.9 % — ABNORMAL LOW (ref 39.0–52.0)
HEMOGLOBIN: 11.6 g/dL — AB (ref 13.0–17.0)
Lymphocytes Relative: 15 % (ref 12–46)
Lymphs Abs: 0.8 10*3/uL (ref 0.7–4.0)
MCH: 29 pg (ref 26.0–34.0)
MCHC: 35.3 g/dL (ref 30.0–36.0)
MCV: 82.3 fL (ref 78.0–100.0)
Monocytes Absolute: 0.6 10*3/uL (ref 0.1–1.0)
Monocytes Relative: 11 % (ref 3–12)
NEUTROS ABS: 4 10*3/uL (ref 1.7–7.7)
Neutrophils Relative %: 73 % (ref 43–77)
PLATELETS: 136 10*3/uL — AB (ref 150–400)
RBC: 4 MIL/uL — ABNORMAL LOW (ref 4.22–5.81)
RDW: 15.9 % — ABNORMAL HIGH (ref 11.5–15.5)
WBC: 5.5 10*3/uL (ref 4.0–10.5)

## 2013-07-14 LAB — MRSA PCR SCREENING: MRSA by PCR: NEGATIVE

## 2013-07-14 LAB — MAGNESIUM: MAGNESIUM: 1.9 mg/dL (ref 1.5–2.5)

## 2013-07-14 MED ORDER — VANCOMYCIN HCL IN DEXTROSE 1-5 GM/200ML-% IV SOLN
1000.0000 mg | INTRAVENOUS | Status: DC
  Filled 2013-07-14: qty 200

## 2013-07-14 MED ORDER — VALPROATE SODIUM 500 MG/5ML IV SOLN
500.0000 mg | Freq: Three times a day (TID) | INTRAVENOUS | Status: DC
  Administered 2013-07-15: 500 mg via INTRAVENOUS
  Filled 2013-07-14 (×3): qty 5

## 2013-07-14 MED ORDER — LEVOFLOXACIN IN D5W 500 MG/100ML IV SOLN
500.0000 mg | INTRAVENOUS | Status: DC
  Administered 2013-07-14: 500 mg via INTRAVENOUS
  Filled 2013-07-14 (×2): qty 100

## 2013-07-14 MED ORDER — ENSURE PUDDING PO PUDG
1.0000 | Freq: Three times a day (TID) | ORAL | Status: DC
  Administered 2013-07-15: 1 via ORAL

## 2013-07-14 MED ORDER — LEVALBUTEROL HCL 1.25 MG/0.5ML IN NEBU
1.2500 mg | INHALATION_SOLUTION | RESPIRATORY_TRACT | Status: DC | PRN
  Filled 2013-07-14: qty 0.5

## 2013-07-14 MED ORDER — RESOURCE THICKENUP CLEAR PO POWD
ORAL | Status: DC | PRN
  Filled 2013-07-14: qty 125

## 2013-07-14 MED ORDER — LORAZEPAM 2 MG/ML IJ SOLN: 0.5000 mg | Freq: Once | INTRAMUSCULAR | Status: DC

## 2013-07-14 MED ORDER — METOPROLOL TARTRATE 1 MG/ML IV SOLN
5.0000 mg | Freq: Four times a day (QID) | INTRAVENOUS | Status: DC | PRN
  Administered 2013-07-14 – 2013-07-15 (×2): 5 mg via INTRAVENOUS
  Filled 2013-07-14 (×2): qty 5

## 2013-07-14 MED ORDER — SODIUM CHLORIDE 0.9 % IV SOLN
100.0000 mg | INTRAVENOUS | Status: DC
  Administered 2013-07-14: 100 mg via INTRAVENOUS
  Filled 2013-07-14 (×2): qty 100

## 2013-07-14 MED ORDER — FAMOTIDINE 20 MG PO TABS
20.0000 mg | ORAL_TABLET | Freq: Every day | ORAL | Status: DC
  Filled 2013-07-14 (×2): qty 1

## 2013-07-14 MED ORDER — VALPROATE SODIUM 500 MG/5ML IV SOLN
1200.0000 mg | Freq: Once | INTRAVENOUS | Status: AC
  Administered 2013-07-14: 1200 mg via INTRAVENOUS
  Filled 2013-07-14: qty 12

## 2013-07-14 NOTE — Progress Notes (Signed)
PT Cancellation Note  Patient Details Name: Matthew RavelFred Reyburn MRN: [id number] DOB: November 26, 1923   Cancelled Treatment:    Reason Eval/Treat Not Completed: Patient at procedure or test/unavailable. Will follow.    Ralene BatheUhlenberg, Manjot Beumer Kistler 07/14/2013, 1:38 PM [phone]

## 2013-07-14 NOTE — Progress Notes (Signed)
Page by Rhetta MuraN Trimaine patient extremely agitated positive hallucinations, positive pulling out lines condom cath. Stated rapid response team had also been called and is currently evaluating patient  General; patient confused, but resting peacefully Cardiac; on exam auscultation sounds as if patient is in sinus tachycardia, however unsure this corresponds with telemetry findings  Pulmonary; rhonchi bibasilar   A/P  A. fib with RVR -On monitor ventricular tachycardia. Rapid Response at bedside and currently seen patient  -.EKG; shows A. fib with RVR  -.SpO2 = 94% on room air -Repeat EKG shows A. fib with RVR, however patient's HR extremely variable ranging between 105-140 with high SBP=160. Modify metoprolol IV 5 mg PRN for HR>100 or SBP > 150   Agitation -Haldol IV 2 mg QID    Time spent 40 minutes

## 2013-07-14 NOTE — Progress Notes (Signed)
Patient ID: Matthew Floyd, male   DOB: 04-11-1924, 80 y.o.   MRN: [id number]  [facility] PROGRESS NOTE  Matthew Floyd:[id number] DOB: 1924-04-21 DOA: August 02, 2013 PCP: Matthew Grout, MD  Brief narrative:  80 y.o. male with COPD, atrial fibrillation on Xarelto, HTN, pressure heal ulcer, who resides at [facility], presented to [facility] ED after noted to be more confused and with poor oral intake, dyspnea with exertion and at rest also noted, with productive cough of yellow sputum. In ED, CXR worrisome for PNA and [facility] asked to admit for further evaluation.   Active Problems:  Acute hypoxic respiratory failure  - appears to be secondary to PNA, pt initially started on Vancomycin and Aztreonam for 2 days, will change to Levaquin  - maintaining oxygen saturations at target range  - continue BD's scheduled and as needed  Bacteremia - possible urinary source - placed on Micafungin  UTI - repeat urine culture for better growth - continue Micafungin and Levaquin  ? Seizure activity - muscle spasms and twitching noted - will ask neurology for assistance, appreciate input  AF (atrial fibrillation)  - rate controlled  - continue Xarelto  COPD (chronic obstructive pulmonary disease)  - clinically compensated, maintaining oxygen saturations at target range  - continue BD's, ABX as noted above  Essential hypertension, benign  - reasonable inpatient control  Pressure ulcer, heel, right, unstageable  - wound care consult  AKI (acute kidney injury)  - likley pre renal from dehydration and poor oral intake  - Cr is trending down and is WNL this AM - BMP in AM  Moderate malnutrition  - secondary to acute on chronic illness  - advance diet as pt able to tolerate   Consultants:  ID Neurology  Procedures/Studies:  CXR 08-02-13 COPD changes with increased right perihilar and basilar markings could represent asymmetric edema though difficult to exclude right basilar PNA.  Dg Abd Portable  August 02, 2013 No acute abdominal abnormality. Large stool burden.  Antibiotics:  Vancomycin 3/21 -->  Aztreonam 3/21 -->  Code Status: DNR  Family Communication: Son at bedside  Disposition Plan: SNF when stable  HPI/Subjective: No events overnight.   Objective: Filed Vitals:   07/14/13 0511 07/14/13 0614 07/14/13 0810 07/14/13 1349  BP: 160/82 147/98  122/90  Pulse: 70 109 75 128  Temp:    97.7 F (36.5 C)  TempSrc:    Axillary  Resp:    24  Height:      Weight:      SpO2:    93%    Intake/Output Summary (Last 24 hours) at 07/14/13 1909 Last data filed at 07/14/13 1828  Gross per 24 hour  Intake    103 ml  Output   2000 ml  Net  -1897 ml    Exam:   General:  Pt is alert but confused, follows some commands   Cardiovascular: Tachycardic, SEM 2/6  Respiratory: Bibasilar rhonchi, diminished breath sounds at bases   Abdomen: Soft, non tender, non distended, bowel sounds present, no guarding  Extremities: No edema, pulses DP and PT palpable bilaterally  Data Reviewed: Basic Metabolic Panel:  Recent Labs Lab 2013/08/02 1705 07/13/13 0140 07/14/13 0455  NA 130* 133* 136*  K 4.5 4.2 3.8  CL 95* 100 103  CO2 20 19 21   GLUCOSE 88 93 80  BUN 54* 51* 36*  CREATININE 1.64* 1.50* 1.13  CALCIUM 7.9* 7.7* 7.9*  MG  --   --  1.9   Liver Function Tests:  Recent  Labs Lab 07/13/13 0140 07/14/13 0455  AST 32 27  ALT 24 21  ALKPHOS 177* 212*  BILITOT 0.5 0.5  PROT 4.8* 5.0*  ALBUMIN 2.2* 2.3*   CBC:  Recent Labs Lab 07/30/2013 1705 07/13/13 0140 07/13/13 1724 07/14/13 0455  WBC 5.1 3.7* 5.1 5.5  NEUTROABS 4.2  --   --  4.0  HGB 11.7* 11.1* 12.1* 11.6*  HCT 34.4* 31.8* 34.4* 32.9*  MCV 83.9 83.0 82.9 82.3  PLT 164 149* 142* 136*   Recent Results (from the past 240 hour(s))  CULTURE, BLOOD (ROUTINE X 2)     Status: None   Collection Time    July 30, 2013  4:25 PM      Result Value Ref Range Status   Specimen Description BLOOD BLOOD RIGHT FOREARM   Final    Special Requests BOTTLES DRAWN AEROBIC AND ANAEROBIC 5CC   Final   Culture  Setup Time     Final   Value: 07/13/2013 00:10     Performed at [facility]   Culture     Final   Value:        BLOOD CULTURE RECEIVED NO GROWTH TO DATE CULTURE WILL BE HELD FOR 5 DAYS BEFORE ISSUING A FINAL NEGATIVE REPORT     Performed at [facility]   Report Status PENDING   Incomplete  CULTURE, BLOOD (ROUTINE X 2)     Status: None   Collection Time    30-Jul-2013  4:29 PM      Result Value Ref Range Status   Specimen Description BLOOD BLOOD RIGHT FOREARM   Final   Special Requests BOTTLES DRAWN AEROBIC AND ANAEROBIC 10 CC   Final   Culture  Setup Time     Final   Value: 07/13/2013 00:10     Performed at [facility]   Culture     Final   Value: YEAST     Note: Gram Stain Report Called to,Read Back By and Verified With: Matthew Floyd @ 1354 07/14/13 BY KRAWS     Performed at [facility]   Report Status PENDING   Incomplete  URINE CULTURE     Status: None   Collection Time    07-30-2013  7:24 PM      Result Value Ref Range Status   Specimen Description URINE, RANDOM   Final   Special Requests NONE   Final   Culture  Setup Time     Final   Value: 07/13/2013 00:06     Performed at [facility] Count     Final   Value: >=100,000 COLONIES/ML     Performed at [facility]   Culture     Final   Value: Multiple bacterial morphotypes present, none predominant. Suggest appropriate recollection if clinically indicated.     Performed at [facility]   Report Status 07/13/2013 FINAL   Final     Scheduled Meds: . collagenase  1 application Topical Daily  . [START ON 07/15/2013] famotidine  20 mg Oral Daily  . feeding supplement (ENSURE)  1 Container Oral TID BM  . guaiFENesin  600 mg Oral BID  . haloperidol lactate  2 mg Intravenous QID  . ipratropium-albuterol  3 mL Nebulization QID  . LORazepam  0.5 mg Intravenous Once  . memantine  5 mg Oral BID   . micafungin [facility]) IV  100 mg Intravenous Q24H  . nicotine  21 mg Transdermal Daily  . Rivaroxaban  15 mg Oral Q supper  . senna-docusate  1 tablet Oral BID  . sodium chloride  3 mL Intravenous Q12H  . tamsulosin  0.4 mg Oral Daily  . valproate sodium  1,200 mg Intravenous Once  . [START ON 07/15/2013] valproate sodium  500 mg Intravenous 3 times per day   Continuous Infusions: . sodium chloride 30 mL/hr (07/13/13 1445)   Debbora PrestoMAGICK-MYERS, ISKRA, MD  [facility] Pager [phone]  If 7PM-7AM, please contact night-coverage [email or web address] Password [email or web address] 07/14/2013, 7:09 PM   LOS: 2 days

## 2013-07-14 NOTE — Consult Note (Addendum)
[facility]     Reason for Consult: yeast in blood    Referring Physician: autoconsult for yeast in blood/Dr. Izola PriceMyers  Active Problems:   AF (atrial fibrillation)   COPD (chronic obstructive pulmonary disease)   Essential hypertension, benign   Pressure ulcer, heel, right, unstageable   AKI (acute kidney injury)   UTI (urinary tract infection)   Encephalopathy acute   Healthcare-associated pneumonia   Dehydration   Pneumonia   . aztreonam  1 g Intravenous Q8H  . collagenase  1 application Topical Daily  . [START ON 07/15/2013] famotidine  20 mg Oral Daily  . guaiFENesin  600 mg Oral BID  . haloperidol lactate  2 mg Intravenous QID  . ipratropium-albuterol  3 mL Nebulization QID  . memantine  5 mg Oral BID  . micafungin [facility](MYCAM[facility]) IV  100 mg Intravenous Q24H  . nicotine  21 mg Transdermal Daily  . Rivaroxaban  15 mg Oral Q supper  . senna-docusate  1 tablet Oral BID  . sodium chloride  3 mL Intravenous Q12H  . tamsulosin  0.4 mg Oral Daily  . vancomycin  1,000 mg Intravenous Q24H    Recommendations: Continue with micafungin pending ID of yeast, hopefully will be able to continue with fluconazole orally  Repeat blood cultures Will need an eye exam after discharge before stopping antibiotics tTE  Assessment: He has yeast in blood, possible urinary source.  He has otherwise been afebrile and no positive bacterial cutlures so will d/c vancomycin and aztreonam.    Antibiotics: micafungin  HPI: Matthew Floyd is a 80 y.o. male with dementia, NH resident, worsening dementia over last several months, comes in with 2 days of worsening confusion.  Poor po, no fever, did have some SOB.  No diarrhea, no other localizing symptoms.  Admitted and blood cultures with yeast in 1/2.  Started on micafungin.     Review of Systems: A comprehensive review of systems was negative.  Past Medical History  Diagnosis Date  . A-fib   . Pacemaker -Biotronik   .  CHF (congestive heart failure)     EF 50% on echo 2013, 44% on nuclear stufy 07/2011  . Anemia, pernicious   . Arthritis   . Back pain   . Cerebral aneurysm   . COPD (chronic obstructive pulmonary disease)   . DDD (degenerative disc disease)   . GERD (gastroesophageal reflux disease)     h/o peptic ulcer  . HTN (hypertension)   . Subdural hematoma   . History of skin cancer   . Cigarette smoker   . Valvular disease     MR/TR on echo prev  . Dementia   . MI (myocardial infarction)   . BPH (benign prostatic hyperplasia)   . CAD (coronary artery disease)     s/p stent placement  . Enlarged prostate   . Dementia   . Fall at home   . Ulcer     right heel  . Broken hip   . UTI (urinary tract infection)     History  Substance Use Topics  . Smoking status: Former Smoker -- 0.50 packs/day for 65 years    Types: Cigarettes    Start date: 04/24/1952  . Smokeless tobacco: Never Used  . Alcohol Use: No    Family History  Problem Relation Age of Onset  . Dementia Mother   . Heart disease Father   . Heart disease Brother    Allergies  Allergen Reactions  .  Flecainide Other (See Comments)    H/o CAD  . Codeine Other (See Comments)    Per MAR  . Morphine And Related Other (See Comments)    Per MAR  . Multaq [Dronedarone] Other (See Comments)    Per MAR  . Penicillins Other (See Comments)    OBJECTIVE: Blood pressure 122/90, pulse 128, temperature 97.7 F (36.5 C), temperature source Axillary, resp. rate 24, height 6' (1.829 m), weight 149 lb 11.1 oz (67.9 kg), SpO2 93.00%. General: awake, does not respond Skin: no rashes,  Lungs: CTA B Cor: RRR without m Abdomen: soft, nt, nd Ext: no edema GU: + condom cath, no rash  Microbiology: Recent Results (from the past 240 hour(s))  CULTURE, BLOOD (ROUTINE X 2)     Status: None   Collection Time    07/11/2013  4:25 PM      Result Value Ref Range Status   Specimen Description BLOOD BLOOD RIGHT FOREARM   Final   Special  Requests BOTTLES DRAWN AEROBIC AND ANAEROBIC 5CC   Final   Culture  Setup Time     Final   Value: 07/13/2013 00:10     Performed at [facility]   Culture     Final   Value:        BLOOD CULTURE RECEIVED NO GROWTH TO DATE CULTURE WILL BE HELD FOR 5 DAYS BEFORE ISSUING A FINAL NEGATIVE REPORT     Performed at [facility]   Report Status PENDING   Incomplete  CULTURE, BLOOD (ROUTINE X 2)     Status: None   Collection Time    07/16/2013  4:29 PM      Result Value Ref Range Status   Specimen Description BLOOD BLOOD RIGHT FOREARM   Final   Special Requests BOTTLES DRAWN AEROBIC AND ANAEROBIC 10 CC   Final   Culture  Setup Time     Final   Value: 07/13/2013 00:10     Performed at [facility]   Culture     Final   Value: YEAST     Note: Gram Stain Report Called to,Read Back By and Verified With: KATIE B @ 1354 07/14/13 BY KRAWS     Performed at [facility]   Report Status PENDING   Incomplete  URINE CULTURE     Status: None   Collection Time    07/11/2013  7:24 PM      Result Value Ref Range Status   Specimen Description URINE, RANDOM   Final   Special Requests NONE   Final   Culture  Setup Time     Final   Value: 07/13/2013 00:06     Performed at [facility] Count     Final   Value: >=100,000 COLONIES/ML     Performed at [facility]   Culture     Final   Value: Multiple bacterial morphotypes present, none predominant. Suggest appropriate recollection if clinically indicated.     Performed at [facility]   Report Status 07/13/2013 FINAL   Final    Staci Righter, MD [facility] [email or web address] [phone] pager  [phone] cell 07/14/2013, 3:09 PM

## 2013-07-14 NOTE — Progress Notes (Signed)
Patient ID: Matthew Floyd, male   DOB: Feb 15, 1924, 80 y.o.   MRN: [id number]     [facility]  Allergies  Allergen Reactions  . Flecainide Other (See Comments)    H/o CAD  . Codeine Other (See Comments)    Per MAR  . Morphine And Related Other (See Comments)    Per MAR  . Multaq [Dronedarone] Other (See Comments)    Per MAR  . Penicillins Other (See Comments)     Chief Complaint  Patient presents with  . Acute Visit    change in status     HPI:  He has experienced sudden shortness of breath; and increased lethargy. Is somnolent; has mild respiratory distress present. The nursing staff states this has happened suddenly. He has a history of copd; has been stable for an extended period of time.    Past Medical History  Diagnosis Date  . A-fib   . Pacemaker -Biotronik   . CHF (congestive heart failure)     EF 50% on echo 2013, 44% on nuclear stufy 07/2011  . Anemia, pernicious   . Arthritis   . Back pain   . Cerebral aneurysm   . COPD (chronic obstructive pulmonary disease)   . DDD (degenerative disc disease)   . GERD (gastroesophageal reflux disease)     h/o peptic ulcer  . HTN (hypertension)   . Subdural hematoma   . History of skin cancer   . Cigarette smoker   . Valvular disease     MR/TR on echo prev  . Dementia   . MI (myocardial infarction)   . BPH (benign prostatic hyperplasia)   . CAD (coronary artery disease)     s/p stent placement  . Enlarged prostate   . Dementia   . Fall at home   . Ulcer     right heel  . Broken hip   . UTI (urinary tract infection)     Past Surgical History  Procedure Laterality Date  . Finger amputation      traumatic  . Angioplasty    . Hernia repair    . Burr hole for subdural hematoma      with evacuation  . Pacemaker placement      biotronik 10/12/09  . Partial gastrectomy    . Cholecystectomy    . Cardiac catheterization    . Coronary angioplasty    . Insert / replace / remove pacemaker    . Orif right hip     . Hip surgery      VITAL SIGNS BP 98/56  Pulse 120  Ht 6' (1.829 m)  Wt 149 lb (67.586 kg)  BMI 20.20 kg/m2   Patient's Medications  New Prescriptions   No medications on file  Previous Medications   ALBUTEROL (PROVENTIL) (2.5 MG/3ML) 0.083% NEBULIZER SOLUTION    Take 3 mLs (2.5 mg total) by nebulization every 6 (six) hours as needed for wheezing or shortness of breath.   COLLAGENASE (SANTYL) OINTMENT    Apply 1 application topically daily.   DIGOXIN (LANOXIN) 0.125 MG TABLET    Take 1 tablet (0.125 mg total) by mouth daily.   DILTIAZEM (CARDIZEM CD) 120 MG 24 HR CAPSULE    Take 1 capsule (120 mg total) by mouth daily.   MELATONIN 3 MG TABS    Take 3 mg by mouth at bedtime.   MEMANTINE HCL ER (NAMENDA XR) 7 MG CP24    Take 2 capsules (14 mg total) by mouth daily.  METHOCARBAMOL (ROBAXIN) 500 MG TABLET    Take 1 tablet (500 mg total) by mouth 2 (two) times daily.   MULTIVITAMIN WITH MINERALS (CERTA-VITE) LIQD    Take 5 mLs by mouth daily.   NAPROXEN SODIUM (ALEVE) 220 MG TABLET    Take 1 tablet (220 mg total) by mouth 2 (two) times daily with a meal.   NICOTINE (NICODERM CQ - DOSED IN MG/24 HOURS) 21 MG/24HR PATCH    Place 21 mg onto the skin daily.   OSTOMY SUPPLIES (SKIN PREP WIPES) MISC    by Does not apply route 2 (two) times daily.   RANITIDINE (ZANTAC) 150 MG TABLET    Take 150 mg by mouth 2 (two) times daily.   RIVAROXABAN (XARELTO) 15 MG TABS TABLET    Take 15 mg by mouth daily with supper.   SENNOSIDES-DOCUSATE SODIUM (SENOKOT-S) 8.6-50 MG TABLET    Take 1 tablet by mouth 2 (two) times daily.   TAMSULOSIN (FLOMAX) 0.4 MG CAPS CAPSULE    Take 1 capsule (0.4 mg total) by mouth daily.   TRAMADOL (ULTRAM) 50 MG TABLET    Take one tablet by mouth every 6 hours as needed for pain 1-5; Take two tablets by mouth every 6 hours as needed for pain 6-10   ZINC OXIDE (BALMEX) 11.3 % CREA CREAM    Apply 1 application topically daily.  Modified Medications   No medications on file    Discontinued Medications   No medications on file    SIGNIFICANT DIAGNOSTIC EXAMS  04-14-13: chest x-ray: no active disease; again noted hyperinflation and chronic mild interstitial prominence  04-14-13: right hip x-ray: minimally displaced intertrochanteric right femur fracture  04-18-13: right femur x-ray: a medullary rod and compression screw are now seen. The fracture fragments of the proximal right femur are in anatomic alignment. No acute abnormality seen.   04-28-13: ABI: RIGHT: 0.57; LEFT 0.75   07-03-13: right foot ct scan: 1. Subcutaneous edema along the lateral ankle and adjacent to the fifth metatarsophalangeal joint may reflect cellulitis, but without bony destructive findings characteristic of osteomyelitis. Three-phase bone scan can provide complementary information if clinically warranted. MRI is contraindicated by the patient's pacemaker. 2. Well corticated medial erosions along the head of the first metatarsal could reflect gout arthropathy.   07-10-13: ekg: afib  07-10-13: chest x-ray: borderline cardiomegaly with lmid pulmonary vascular congestion. No pleural effusion. No inflammatory consolidate or suspicious nodule; mild bibasilar atelectasis     LABS REVIEWED:   04-14-13: wbc 8.7; hgb 13.3; hct 39.9; mcv 94; plt 219; glucose 133; bun 16; creat 1.17; k+2.9; na++140; liver normal albumin 3.1  04-16-13: wbc 9.6; hgb 11.0; hct 32.7; mcv 94 ;plt 145 glucose 81; bun 12; creat 0.84; k+3.6; na++136  04-18-13: wbc 7.6; hgb 11.6; hct 33.0; mcv 93; plt 158; glucose 98; bun 10; creat 1.01; k+3.6;na++ 137 04-23-13:wbc 7.8; hgb 10.1; hct 30.9; mcv 98.1; plt 308; glucose 126; bun 12;creat 1.2; k+3.8;na++137 04-30-13: wbc 7.3; hgb 11.6; hct 34.0; mcv 95.5; plt 509; glucose 79; bun 14; creat 1.0; k+4.3; na++ 138 05-19-13: urine culture: k pneumoniae: levaquin  06-06-13: urine culture: yeast: diflucan  06-17-13: wbc 5.0; hgb10.9; hct 33.3; mcv 88.3. ;plt 230 07-02-13: glucose 72; bun 25;  creat 1.0; k+4.5; na++136  07-10-13; wbc 13.7; hgb 13.8; hct 41.7; mcv 89; plt 295; glucose 242; bun 32;creat 1.81k+4.4 ;na++137     Review of Systems  Unable to perform ROS   Physical Exam  Constitutional: No distress.  frail  Neck: Neck supple. No JVD present.  Cardiovascular: Normal rate and intact distal pulses.   Heart rate irregular   Respiratory: respiratory distress; breath sounds diminished throughout.  02 dependent  GI: Soft. Bowel sounds are normal. He exhibits no distension. There is no tenderness.  Musculoskeletal: He exhibits no edema.  Has limited range of motion to the right lower extremity; is able to move all other extremities Has generalized tenderness on his back without focal area present.    Neurological: lethargic   Skin: Skin is warm and dry. He is not diaphoretic.  Right heel distal wound stage III: 2.5 x 3.0 x 0.2 cm   50% slough and 50% granulating tissue present.  Right heel proximal wound stage III: 2.6 x 1.5 x 0.2 cm  50% slough and 50% granulating tissue present.  Left great toe : 0.5 x 0.7 cm slough with eschar in center        ASSESSMENT/ PLAN:  1. Copd exacerbation/acute renal failure: will being D5 1/2 ns at 75 cc per hour for 2 liters;will begin pulmicort neb twice daily for one week then symbicort 80/4.5 2 puffs twice daily will continue to monitor his status; his ua is pending.

## 2013-07-14 NOTE — Progress Notes (Signed)
INITIAL NUTRITION ASSESSMENT  DOCUMENTATION CODES Per approved criteria  -Severe malnutrition in the context of chronic illness   INTERVENTION: 1.  General healthful diet; encourage intake of foods and beverages as able.  RD to follow and assess for nutritional adequacy.  2.  Supplements; Ensure Pudding po TID, each supplement provides 170 kcal and 4 grams of protein  NUTRITION DIAGNOSIS: Malnutrition related to chronic illness as evidenced by nutrition focused physical exam.   Monitor:  1.  Food/Beverage; pt meeting >/=90% estimated needs with tolerance. 2.  Wt/wt change; monitor trends  Reason for Assessment: MST  80 y.o. male  Admitting Dx: confusion, poor PO  ASSESSMENT: Pt admitted from NH with AMS and poor PO.  Pt with hypoxic respiratory failure, unstageable ulcer to heel. Pt preparing for transfer to Nuclear medicine at time of visit.  RD able to perform limited physical exam.  Pt with recent weights around 145 lbs.  Current wt is consistent.   Nutrition Focused Physical Exam: Subcutaneous Fat:  Orbital Region: severe wasting Upper Arm Region: severe wasting Thoracic and Lumbar Region: severe wasting  Muscle:  Temple Region: severe wasting Clavicle Bone Region: severe wasting Clavicle and Acromion Bone Region: not assessed Scapular Bone Region: not assessed Dorsal Hand: severe wasting Patellar Region: not assessed Anterior Thigh Region: not assessed Posterior Calf Region: not assessed  Edema: none present  No family at bedside.   Pt seen by SLP who determined pt appropriate for Dysphagia 1, nectar-thick liquids due to aspiration risk.  Pt would benefit from supplementation.  RD to order.   Height: Ht Readings from Last 1 Encounters:  07/13/13 6' (1.829 m)    Weight: Wt Readings from Last 1 Encounters:  07/13/13 149 lb 11.1 oz (67.9 kg)    Ideal Body Weight: 80.9 kg  % Ideal Body Weight: 83%  Wt Readings from Last 10 Encounters:  07/13/13 149 lb  11.1 oz (67.9 kg)  07/14/13 149 lb (67.586 kg)  07/09/13 149 lb (67.586 kg)  07/04/13 142 lb 12.8 oz (64.774 kg)  06/16/13 143 lb (64.864 kg)  06/03/13 142 lb 9.6 oz (64.683 kg)  05/30/13 142 lb (64.411 kg)  06/03/13 143 lb (64.864 kg)  05/29/13 142 lb 3.2 oz (64.501 kg)  05/20/13 143 lb (64.864 kg)    Usual Body Weight: 145 lbs  % Usual Body Weight: 102%  BMI:  Body mass index is 20.3 kg/(m^2).  Estimated Nutritional Needs: Kcal: 1950-2150 Protein: 80-90g Fluid: >2.0 L/day  Skin: unstageable to heel  Diet Order: Dysphagia 1, nectar-thick  EDUCATION NEEDS: -Education needs addressed   Intake/Output Summary (Last 24 hours) at 07/14/13 1534 Last data filed at 07/14/13 0300  Gross per 24 hour  Intake    803 ml  Output   1200 ml  Net   -397 ml    Last BM: 3/21   Labs:   Recent Labs Lab Jul 23, 2013 1705 07/13/13 0140 07/14/13 0455  NA 130* 133* 136*  K 4.5 4.2 3.8  CL 95* 100 103  CO2 20 19 21   BUN 54* 51* 36*  CREATININE 1.64* 1.50* 1.13  CALCIUM 7.9* 7.7* 7.9*  MG  --   --  1.9  GLUCOSE 88 93 80    CBG (last 3)  No results found for this basename: GLUCAP,  in the last 72 hours  Scheduled Meds: . collagenase  1 application Topical Daily  . [START ON 07/15/2013] famotidine  20 mg Oral Daily  . guaiFENesin  600 mg Oral BID  .  haloperidol lactate  2 mg Intravenous QID  . ipratropium-albuterol  3 mL Nebulization QID  . memantine  5 mg Oral BID  . micafungin [facility](MYCAMINE) IV  100 mg Intravenous Q24H  . nicotine  21 mg Transdermal Daily  . Rivaroxaban  15 mg Oral Q supper  . senna-docusate  1 tablet Oral BID  . sodium chloride  3 mL Intravenous Q12H  . tamsulosin  0.4 mg Oral Daily    Continuous Infusions: . sodium chloride 30 mL/hr (07/13/13 1445)    Past Medical History  Diagnosis Date  . A-fib   . Pacemaker -Biotronik   . CHF (congestive heart failure)     EF 50% on echo 2013, 44% on nuclear stufy 07/2011  . Anemia, pernicious   . Arthritis    . Back pain   . Cerebral aneurysm   . COPD (chronic obstructive pulmonary disease)   . DDD (degenerative disc disease)   . GERD (gastroesophageal reflux disease)     h/o peptic ulcer  . HTN (hypertension)   . Subdural hematoma   . History of skin cancer   . Cigarette smoker   . Valvular disease     MR/TR on echo prev  . Dementia   . MI (myocardial infarction)   . BPH (benign prostatic hyperplasia)   . CAD (coronary artery disease)     s/p stent placement  . Enlarged prostate   . Dementia   . Fall at home   . Ulcer     right heel  . Broken hip   . UTI (urinary tract infection)     Past Surgical History  Procedure Laterality Date  . Finger amputation      traumatic  . Angioplasty    . Hernia repair    . Burr hole for subdural hematoma      with evacuation  . Pacemaker placement      biotronik 10/12/09  . Partial gastrectomy    . Cholecystectomy    . Cardiac catheterization    . Coronary angioplasty    . Insert / replace / remove pacemaker    . Orif right hip    . Hip surgery      Loyce DysKacie Sequoyah Counterman, MS RD LDN Clinical Inpatient Dietitian Pager: [phone] Weekend/After hours pager: [phone]

## 2013-07-14 NOTE — Evaluation (Signed)
Clinical/Bedside Swallow Evaluation Patient Details  Name: Matthew Floyd MRN: [id number] Date of Birth: 1923/10/31  Today's Date: 07/14/2013 Time: [id number] SLP Time Calculation (min): 17 min  Past Medical History:  Past Medical History  Diagnosis Date  . A-fib   . Pacemaker -Biotronik   . CHF (congestive heart failure)     EF 50% on echo 2013, 44% on nuclear stufy 07/2011  . Anemia, pernicious   . Arthritis   . Back pain   . Cerebral aneurysm   . COPD (chronic obstructive pulmonary disease)   . DDD (degenerative disc disease)   . GERD (gastroesophageal reflux disease)     h/o peptic ulcer  . HTN (hypertension)   . Subdural hematoma   . History of skin cancer   . Cigarette smoker   . Valvular disease     MR/TR on echo prev  . Dementia   . MI (myocardial infarction)   . BPH (benign prostatic hyperplasia)   . CAD (coronary artery disease)     s/p stent placement  . Enlarged prostate   . Dementia   . Fall at home   . Ulcer     right heel  . Broken hip   . UTI (urinary tract infection)    Past Surgical History:  Past Surgical History  Procedure Laterality Date  . Finger amputation      traumatic  . Angioplasty    . Hernia repair    . Burr hole for subdural hematoma      with evacuation  . Pacemaker placement      biotronik 10/12/09  . Partial gastrectomy    . Cholecystectomy    . Cardiac catheterization    . Coronary angioplasty    . Insert / replace / remove pacemaker    . Orif right hip    . Hip surgery     HPI:  80 y.o. male with COPD, atrial fibrillation on Xarelto, HTN, pressure heal ulcer, who [facility], [facility] after noted to be more confused and with poor oral intake, dyspnea with exertion and at rest also noted, with productive cough of yellow sputum. In ED, CXR worrisome for PNA and TRH asked to admit for further evaluation.    Assessment / Plan / Recommendation Clinical Impression  Pt presents with moderate oral dysphagia  with decreased mandibular closure and poor bolus formation and propulsion. Dentition entirely ineffective. Pt cannot tolerate solids other than puree. Swallow reponse suspected to be significantly delayed with overt signs of aspiration before the swallow with 75% of thin liquid trials. Nectar and puree do also result in signs concerning for pharyngeal residue (progressively wet vocal quality, cough with expectoration of oatmeal, delayed throat clearing. Suspect a significant age related dyspahgia. Recommend downgrade to puree/nectar though likely any POs carry some risk of aspiration. Pt needs max assist and cues for simple intake of puree and straw sips of liquids (improved oral control with straw), he would likely not utilize compensatory strategies effectively. SLP will follow for tolerance, but again moderate risk anticipated.     Aspiration Risk  Moderate    Diet Recommendation Dysphagia 1 (Puree);Nectar-thick liquid   Liquid Administration via: Straw Medication Administration: Crushed with puree Supervision: Staff to assist with self feeding;Full supervision/cueing for compensatory strategies Compensations: Small sips/bites;Multiple dry swallows after each bite/sip;Clear throat intermittently Postural Changes and/or Swallow Maneuvers: Seated upright 90 degrees;Out of bed for meals    Other  Recommendations Oral Care Recommendations: Oral care BID Other  Recommendations: Order thickener from pharmacy   Follow Up Recommendations  Skilled Nursing facility    Frequency and Duration min 2x/week  2 weeks   Pertinent Vitals/Pain NA    SLP Swallow Goals     Swallow Study Prior Functional Status       General HPI: 80 y.o. male with COPD, atrial fibrillation on Xarelto, HTN, pressure heal ulcer, who [facility] at [facility], [facility] ED after noted to be more confused and with poor oral intake, dyspnea with exertion and at rest also noted, with productive cough of yellow sputum. In  ED, CXR worrisome for PNA and TRH asked to admit for further evaluation.  Type of Study: Bedside swallow evaluation Previous Swallow Assessment: none Diet Prior to this Study: Regular;Thin liquids Temperature Spikes Noted: No Respiratory Status: Room air History of Recent Intubation: No Behavior/Cognition: Alert;Cooperative;Requires cueing Oral Cavity - Dentition: Dentures, top (botoom dentures float in mouth, removed) Self-Feeding Abilities: Total assist Patient Positioning: Upright in bed Baseline Vocal Quality: Clear Volitional Cough: Strong Volitional Swallow: Unable to elicit    Oral/Motor/Sensory Function Overall Oral Motor/Sensory Function: Other (comment) (mostly open mouth posture, poor mandibular closure) Mandible: Impaired   Ice Chips Ice chips: Impaired Presentation: Spoon Oral Phase Impairments: Reduced labial seal;Impaired anterior to posterior transit;Reduced lingual movement/coordination Oral Phase Functional Implications: Prolonged oral transit Pharyngeal Phase Impairments: Throat Clearing - Immediate;Suspected delayed Swallow;Decreased hyoid-laryngeal movement   Thin Liquid Thin Liquid: Impaired Presentation: Cup;Straw Oral Phase Impairments: Reduced labial seal;Reduced lingual movement/coordination;Impaired anterior to posterior transit Pharyngeal  Phase Impairments: Suspected delayed Swallow;Decreased hyoid-laryngeal movement;Wet Vocal Quality;Cough - Immediate    Nectar Thick Nectar Thick Liquid: Impaired Presentation: Straw Oral Phase Impairments: Reduced labial seal;Impaired anterior to posterior transit Oral phase functional implications: Prolonged oral transit Pharyngeal Phase Impairments: Decreased hyoid-laryngeal movement;Suspected delayed Swallow;Throat Clearing - Delayed   Honey Thick Honey Thick Liquid: Not tested   Puree Puree: Impaired Oral Phase Impairments: Reduced labial seal;Impaired anterior to posterior transit;Reduced lingual  movement/coordination Oral Phase Functional Implications: Prolonged oral transit Pharyngeal Phase Impairments: Suspected delayed Swallow;Decreased hyoid-laryngeal movement;Wet Vocal Quality;Throat Clearing - Delayed   Solid   GO    Solid: Not tested      Harlon Ditty, MA CCC-SLP [phone]  Claudine Mouton 07/14/2013,10:06 AM

## 2013-07-14 NOTE — Consult Note (Signed)
NEURO HOSPITALIST CONSULT NOTE    Reason for Consult: Possible seizure  HPI:                                                                                                                                          Matthew Floyd is an 80 y.o. male  with Afib,known dementia with declining mental status, who resides at a nursing home. He was brought into hospital due to declining mentation, SOB, productive cough and decreased urine out put. On admission he was found to have a UTI and PNA. Patient has been placed on ABX. Per granddaughter he has been declining over the past few months and also showing FTT.  He has no history of seizure.  Nursing student noted a period of time where patient was stiff and unresponsive.  He quickly cleared but while in US he again was noted to have shaking movements. While in the room he follows no commands but does startle easily and extend his extremities, he also exhibits polymyoclonus at times. Neurology was asked to evaluate for seizure.    Past Medical History  Diagnosis Date  . A-fib   . Pacemaker -Biotronik   . CHF (congestive heart failure)     EF 50% on echo 2013, 44% on nuclear stufy 07/2011  . Anemia, pernicious   . Arthritis   . Back pain   . Cerebral aneurysm   . COPD (chronic obstructive pulmonary disease)   . DDD (degenerative disc disease)   . GERD (gastroesophageal reflux disease)     h/o peptic ulcer  . HTN (hypertension)   . Subdural hematoma   . History of skin cancer   . Cigarette smoker   . Valvular disease     MR/TR on echo prev  . Dementia   . MI (myocardial infarction)   . BPH (benign prostatic hyperplasia)   . CAD (coronary artery disease)     s/p stent placement  . Enlarged prostate   . Dementia   . Fall at home   . Ulcer     right heel  . Broken hip   . UTI (urinary tract infection)     Past Surgical History  Procedure Laterality Date  . Finger amputation      traumatic  . Angioplasty    .  Hernia repair    . Burr hole for subdural hematoma      with evacuation  . Pacemaker placement      biotronik 10/12/09  . Partial gastrectomy    . Cholecystectomy    . Cardiac catheterization    . Coronary angioplasty    . Insert / replace / remove pacemaker    . Orif right hip    . Hip surgery      Family History  Problem Relation Age of Onset  . Dementia Mother   . Heart disease Father   . Heart disease Brother      Social History:  reports that he has quit smoking. His smoking use included Cigarettes. He started smoking about 61 years ago. He has a 32.5 pack-year smoking history. He has never used smokeless tobacco. He reports that he does not drink alcohol or use illicit drugs.  Allergies  Allergen Reactions  . Flecainide Other (See Comments)    H/o CAD  . Codeine Other (See Comments)    Per MAR  . Morphine And Related Other (See Comments)    Per MAR  . Multaq [Dronedarone] Other (See Comments)    Per MAR  . Penicillins Other (See Comments)    MEDICATIONS:                                                                                                                     Prior to Admission:  Prescriptions prior to admission  Medication Sig Dispense Refill  . albuterol (PROVENTIL) (2.5 MG/3ML) 0.083% nebulizer solution Take 3 mLs (2.5 mg total) by nebulization every 6 (six) hours as needed for wheezing or shortness of breath.  75 mL  12  . collagenase (SANTYL) ointment Apply 1 application topically daily.      . digoxin (LANOXIN) 0.125 MG tablet Take 1 tablet (0.125 mg total) by mouth daily.  30 tablet  12  . diltiazem (CARDIZEM CD) 120 MG 24 hr capsule Take 1 capsule (120 mg total) by mouth daily.  30 capsule  12  . Melatonin 3 MG TABS Take 3 mg by mouth at bedtime.      . Memantine HCl ER (NAMENDA XR) 7 MG CP24 Take 2 capsules (14 mg total) by mouth daily.  30 capsule  11  . methocarbamol (ROBAXIN) 500 MG tablet Take 1 tablet (500 mg total) by mouth 2 (two) times  daily.  60 tablet  11  . multivitamin with minerals (CERTA-VITE) LIQD Take 5 mLs by mouth daily.      . nicotine (NICODERM CQ - DOSED IN MG/24 HOURS) 21 mg/24hr patch Place 21 mg onto the skin daily.      . ranitidine (ZANTAC) 150 MG tablet Take 150 mg by mouth 2 (two) times daily.      . Rivaroxaban (XARELTO) 15 MG TABS tablet Take 15 mg by mouth daily with supper.      . sennosides-docusate sodium (SENOKOT-S) 8.6-50 MG tablet Take 1 tablet by mouth 2 (two) times daily.      . tamsulosin (FLOMAX) 0.4 MG CAPS capsule Take 1 capsule (0.4 mg total) by mouth daily.  30 capsule  12  . traMADol (ULTRAM) 50 MG tablet Take one tablet by mouth every 6 hours as needed for pain 1-5; Take two tablets by mouth every 6 hours as needed for pain 6-10  360 tablet  5  . traMADol (ULTRAM) 50 MG tablet Take 50 mg by mouth every 6 (six) hours  as needed for moderate pain.       Scheduled: . collagenase  1 application Topical Daily  . [START ON 07/15/2013] famotidine  20 mg Oral Daily  . feeding supplement (ENSURE)  1 Container Oral TID BM  . guaiFENesin  600 mg Oral BID  . haloperidol lactate  2 mg Intravenous QID  . ipratropium-albuterol  3 mL Nebulization QID  . LORazepam  0.5 mg Intravenous Once  . memantine  5 mg Oral BID  . micafungin [facility]) IV  100 mg Intravenous Q24H  . nicotine  21 mg Transdermal Daily  . Rivaroxaban  15 mg Oral Q supper  . senna-docusate  1 tablet Oral BID  . sodium chloride  3 mL Intravenous Q12H  . tamsulosin  0.4 mg Oral Daily     ROS:                                                                                                                                       History obtained from unobtainable from patient due to mental status    Blood pressure 122/90, pulse 128, temperature 97.7 F (36.5 C), temperature source Axillary, resp. rate 24, height 6' (1.829 m), weight 67.9 kg (149 lb 11.1 oz), SpO2 93.00%.   Neurologic Examination:                                                                                                       Mental Status: patient withdraws from pain and becomes agitated when trying to examine.  He will localize to pain with his right hand. He holds his eye held shut and will not follow verbal or visual commands. At times he startles very easily.  Cranial Nerves: II: Discs flat bilaterally; blinks to threat bilaterally, pupils equal, round, reactive to light and accommodation III,IV, VI: ptosis not present, eyes are held at midline and he does not follow commands or look to left or right.  V,VII: face symmetric,winces to noxious stimuli bilateraly VIII: unable to assess IX,X: gag reflex present XI: bilateral shoulder shrug XII: midline tongue extension   Motor: Moves bilateral UE antigravity spontaneously and withdraws to pain.  Slightly withdraws to pain in bilateral LE. When startled he will extend bilateral UE and LE.  At times he hold his right hand in the air and makes a fist pumping motion but this will cease when he relaxes.  Sensory: winces to pain bilaterally in all extremities.  Deep Tendon Reflexes:  Right: Upper Extremity  Left: Upper extremity   biceps (C-5 to C-6) 2/4   biceps (C-5 to C-6) 2/4 tricep (C7) 2/4    triceps (C7) 2/4 Brachioradialis (C6) 2/4  Brachioradialis (C6) 2/4  Lower Extremity Lower Extremity  quadriceps (L-2 to L-4) 1/4   quadriceps (L-2 to L-4) 2/4 Achilles (S1) 0/4   Achilles (S1) 0/4  Plantars: Mute bilaterally Cerebellar: Unable to assess Gait: unable to assess.  CV: pulses palpable throughout    Lab Results: Basic Metabolic Panel:  Recent Labs Lab 07/04/2013 1705 07/13/13 0140 07/14/13 0455  NA 130* 133* 136*  K 4.5 4.2 3.8  CL 95* 100 103  CO2 20 19 21   GLUCOSE 88 93 80  BUN 54* 51* 36*  CREATININE 1.64* 1.50* 1.13  CALCIUM 7.9* 7.7* 7.9*  MG  --   --  1.9    Liver Function Tests:  Recent Labs Lab 07/13/13 0140 07/14/13 0455  AST 32 27  ALT 24 21  ALKPHOS  177* 212*  BILITOT 0.5 0.5  PROT 4.8* 5.0*  ALBUMIN 2.2* 2.3*   No results found for this basename: LIPASE, AMYLASE,  in the last 168 hours No results found for this basename: AMMONIA,  in the last 168 hours  CBC:  Recent Labs Lab 06/27/2013 1705 07/13/13 0140 07/13/13 1724 07/14/13 0455  WBC 5.1 3.7* 5.1 5.5  NEUTROABS 4.2  --   --  4.0  HGB 11.7* 11.1* 12.1* 11.6*  HCT 34.4* 31.8* 34.4* 32.9*  MCV 83.9 83.0 82.9 82.3  PLT 164 149* 142* 136*    Cardiac Enzymes: No results found for this basename: CKTOTAL, CKMB, CKMBINDEX, TROPONINI,  in the last 168 hours  Lipid Panel: No results found for this basename: CHOL, TRIG, HDL, CHOLHDL, VLDL, LDLCALC,  in the last 168 hours  CBG: No results found for this basename: GLUCAP,  in the last 168 hours  Microbiology: Results for orders placed during the hospital encounter of 07/18/2013  CULTURE, BLOOD (ROUTINE X 2)     Status: None   Collection Time    07/16/2013  4:25 PM      Result Value Ref Range Status   Specimen Description BLOOD BLOOD RIGHT FOREARM   Final   Special Requests BOTTLES DRAWN AEROBIC AND ANAEROBIC 5CC   Final   Culture  Setup Time     Final   Value: 07/13/2013 00:10     Performed at [facility]   Culture     Final   Value:        BLOOD CULTURE RECEIVED NO GROWTH TO DATE CULTURE WILL BE HELD FOR 5 DAYS BEFORE ISSUING A FINAL NEGATIVE REPORT     Performed at [facility]   Report Status PENDING   Incomplete  CULTURE, BLOOD (ROUTINE X 2)     Status: None   Collection Time    07/13/2013  4:29 PM      Result Value Ref Range Status   Specimen Description BLOOD BLOOD RIGHT FOREARM   Final   Special Requests BOTTLES DRAWN AEROBIC AND ANAEROBIC 10 CC   Final   Culture  Setup Time     Final   Value: 07/13/2013 00:10     Performed at [facility]   Culture     Final   Value: YEAST     Note: Gram Stain Report Called to,Read Back By and Verified With: KATIE B @ 1354 07/14/13 BY KRAWS     Performed  at [facility]  Report Status PENDING   Incomplete  URINE CULTURE     Status: None   Collection Time    07/13/2013  7:24 PM      Result Value Ref Range Status   Specimen Description URINE, RANDOM   Final   Special Requests NONE   Final   Culture  Setup Time     Final   Value: 07/13/2013 00:06     Performed at [facility] Count     Final   Value: >=100,000 COLONIES/ML     Performed at [facility]   Culture     Final   Value: Multiple bacterial morphotypes present, none predominant. Suggest appropriate recollection if clinically indicated.     Performed at [facility]   Report Status 07/13/2013 FINAL   Final    Coagulation Studies: No results found for this basename: LABPROT, INR,  in the last 72 hours  Imaging: US Renal  07/13/2013   CLINICAL DATA:  80 year old male with renal failure.  EXAM: RENAL/URINARY TRACT ULTRASOUND COMPLETE  COMPARISON:  03/12/2013 to a  FINDINGS: Right Kidney:  Length: 10.9 cm. Echogenicity within normal limits. No mass or hydronephrosis visualized.  Left Kidney:  Length: 11.6 cm. Echogenicity within normal limits. No mass or hydronephrosis visualized.  Bladder:  Debris within the bladder is noted.  Prostate enlargement is present.  A small amount of ascites is noted.  Pleural effusions are present.  IMPRESSION: Normal kidneys.  No evidence of hydronephrosis.  Debris within the bladder suspicious for infection.  Small amount of ascites and bilateral pleural effusions.   Electronically Signed   By: Laveda Abbe M.D.   On: 07/13/2013 16:15   Dg Abd Portable 1v  07/08/2013   CLINICAL DATA:  Nausea and vomiting. Bilateral flank pain. Prior gastrectomy for ulcer disease.  EXAM: PORTABLE ABDOMEN - 1 VIEW  COMPARISON:  CT abdomen and pelvis 03/12/2013.  FINDINGS: Bowel gas pattern unremarkable without evidence of obstruction or significant ileus. Large stool burden in the colon. Surgical clips in a right upper quadrant from prior  cholecystectomy. Severe degenerative changes throughout the lumbar spine. Aortoiliofemoral atherosclerosis.  IMPRESSION: No acute abdominal abnormality.  Large stool burden.   Electronically Signed   By: Hulan Saas M.D.   On: 06/24/2013 23:18       Assessment and plan per attending neurologist  Felicie Morn PA-C [facility] [phone]  07/14/2013, 5:17 PM  I have seen and evaluated the patient. I have reviewed the above note and made appropriate changes.   Assessment/Plan: 80 YO male with known dementia who was admitted to hospital due to worsening mental status and SOB. Patient found to have fungal growth in blood and possible seizure activity while in hospital. He has had a waxing and waning mental status since admission, but daughter reports it appears worse today.  Possibilities includee post-ictal state, delirium. Given the change, I would favor starting AED for possible seizures, though this is not clear at this time. Depakote can also be helpful sometimes in patients with deilrium and therefore will choose this agent.   Recommend: 1) EEG 2) Ct head 3) depakote 20 mg/kg now, then 500mg  TID  Ritta Slot, MD [facility] [phone]  If 7pm- 7am, please page neurology on call as listed in [facility]ION.

## 2013-07-14 NOTE — Progress Notes (Signed)
Patient ID: Matthew Floyd, male   DOB: [birth year], 80 y.o.   MRN: [id number]     [facility]  Allergies  Allergen Reactions  . Flecainide Other (See Comments)    H/o CAD  . Codeine Other (See Comments)    Per MAR  . Morphine And Related Other (See Comments)    Per MAR  . Multaq [Dronedarone] Other (See Comments)    Per MAR  . Penicillins Other (See Comments)     Chief Complaint  Patient presents with  . Acute Visit    wound management     HPI:  He is being seen for his wounds he was started on cipro for 4 weeks for possible osteomyelitis on 07-04-13. He is pending a bone scan to confirm the diagnosis. The ct scan was inconclusive. The great toe wound now as slough with a necrotic center present.   Past Medical History  Diagnosis Date  . A-fib   . Pacemaker -Biotronik   . CHF (congestive heart failure)     EF 50% on echo 2013, 44% on nuclear stufy 07/2011  . Anemia, pernicious   . Arthritis   . Back pain   . Cerebral aneurysm   . COPD (chronic obstructive pulmonary disease)   . DDD (degenerative disc disease)   . GERD (gastroesophageal reflux disease)     h/o peptic ulcer  . HTN (hypertension)   . Subdural hematoma   . History of skin cancer   . Cigarette smoker   . Valvular disease     MR/TR on echo prev  . Dementia   . MI (myocardial infarction)   . BPH (benign prostatic hyperplasia)   . CAD (coronary artery disease)     s/p stent placement  . Enlarged prostate   . Dementia   . Fall at home   . Ulcer     right heel  . Broken hip   . UTI (urinary tract infection)     Past Surgical History  Procedure Laterality Date  . Finger amputation      traumatic  . Angioplasty    . Hernia repair    . Burr hole for subdural hematoma      with evacuation  . Pacemaker placement      biotronik 10/12/09  . Partial gastrectomy    . Cholecystectomy    . Cardiac catheterization    . Coronary angioplasty    . Insert / replace / remove pacemaker    . Orif right  hip    . Hip surgery      VITAL SIGNS BP 119/70  Pulse 70  Ht 6' (1.829 m)  Wt 149 lb (67.586 kg)  BMI 20.20 kg/m2   Patient's Medications  New Prescriptions   No medications on file  Previous Medications   ALBUTEROL (PROVENTIL) (2.5 MG/3ML) 0.083% NEBULIZER SOLUTION    Take 3 mLs (2.5 mg total) by nebulization every 6 (six) hours as needed for wheezing or shortness of breath.   COLLAGENASE (SANTYL) OINTMENT    Apply 1 application topically daily.   DIGOXIN (LANOXIN) 0.125 MG TABLET    Take 1 tablet (0.125 mg total) by mouth daily.   DILTIAZEM (CARDIZEM CD) 120 MG 24 HR CAPSULE    Take 1 capsule (120 mg total) by mouth daily.   MELATONIN 3 MG TABS    Take 3 mg by mouth at bedtime.   MEMANTINE HCL ER (NAMENDA XR) 7 MG CP24    Take 2 capsules (14 mg total)  by mouth daily.   METHOCARBAMOL (ROBAXIN) 500 MG TABLET    Take 1 tablet (500 mg total) by mouth 2 (two) times daily.   MULTIVITAMIN WITH MINERALS (CERTA-VITE) LIQD    Take 5 mLs by mouth daily.   NAPROXEN SODIUM (ALEVE) 220 MG TABLET    Take 1 tablet (220 mg total) by mouth 2 (two) times daily with a meal.   NICOTINE (NICODERM CQ - DOSED IN MG/24 HOURS) 21 MG/24HR PATCH    Place 21 mg onto the skin daily.   OSTOMY SUPPLIES (SKIN PREP WIPES) MISC    by Does not apply route 2 (two) times daily.   RANITIDINE (ZANTAC) 150 MG TABLET    Take 150 mg by mouth 2 (two) times daily.   RIVAROXABAN (XARELTO) 15 MG TABS TABLET    Take 15 mg by mouth daily with supper.   SENNOSIDES-DOCUSATE SODIUM (SENOKOT-S) 8.6-50 MG TABLET    Take 1 tablet by mouth 2 (two) times daily.   TAMSULOSIN (FLOMAX) 0.4 MG CAPS CAPSULE    Take 1 capsule (0.4 mg total) by mouth daily.   TRAMADOL (ULTRAM) 50 MG TABLET    Take one tablet by mouth every 6 hours as needed for pain 1-5; Take two tablets by mouth every 6 hours as needed for pain 6-10   ZINC OXIDE (BALMEX) 11.3 % CREA CREAM    Apply 1 application topically daily.  Modified Medications   No medications on file    Discontinued Medications   No medications on file    SIGNIFICANT DIAGNOSTIC EXAMS  04-14-13: chest x-ray: no active disease; again noted hyperinflation and chronic mild interstitial prominence  04-14-13: right hip x-ray: minimally displaced intertrochanteric right femur fracture  04-18-13: right femur x-ray: a medullary rod and compression screw are now seen. The fracture fragments of the proximal right femur are in anatomic alignment. No acute abnormality seen.   04-28-13: ABI: RIGHT: 0.57; LEFT 0.75   07-03-13: right foot ct scan: 1. Subcutaneous edema along the lateral ankle and adjacent to the fifth metatarsophalangeal joint may reflect cellulitis, but without bony destructive findings characteristic of osteomyelitis. Three-phase bone scan can provide complementary information if clinically warranted. MRI is contraindicated by the patient's pacemaker. 2. Well corticated medial erosions along the head of the first metatarsal could reflect gout arthropathy.     LABS REVIEWED:   04-14-13: wbc 8.7; hgb 13.3; hct 39.9; mcv 94; plt 219; glucose 133; bun 16; creat 1.17; k+2.9; na++140; liver normal albumin 3.1  04-16-13: wbc 9.6; hgb 11.0; hct 32.7; mcv 94 ;plt 145 glucose 81; bun 12; creat 0.84; k+3.6; na++136  04-18-13: wbc 7.6; hgb 11.6; hct 33.0; mcv 93; plt 158; glucose 98; bun 10; creat 1.01; k+3.6;na++ 137 04-23-13:wbc 7.8; hgb 10.1; hct 30.9; mcv 98.1; plt 308; glucose 126; bun 12;creat 1.2; k+3.8;na++137 04-30-13: wbc 7.3; hgb 11.6; hct 34.0; mcv 95.5; plt 509; glucose 79; bun 14; creat 1.0; k+4.3; na++ 138 05-19-13: urine culture: k pneumoniae: levaquin  06-06-13: urine culture: yeast: diflucan  06-17-13: wbc 5.0; hgb10.9; hct 33.3; mcv 88.3. ;plt 230 07-02-13: glucose 72; bun 25; creat 1.0; k+4.5; na++136     Review of Systems  Unable to perform ROS   Physical Exam  Constitutional: No distress.  frail  Neck: Neck supple. No JVD present.  Cardiovascular: Normal rate and intact  distal pulses.   Heart rate irregular   Respiratory: Effort normal and breath sounds normal. No respiratory distress.  02 dependent  GI: Soft. Bowel sounds are normal.  He exhibits no distension. There is no tenderness.  Musculoskeletal: He exhibits no edema.  Has limited range of motion to the right lower extremity; is able to move all other extremities Has generalized tenderness on his back without focal area present.    Neurological: He is alert.  Skin: Skin is warm and dry. He is not diaphoretic.  Right heel distal wound stage III: 2.5 x 3.0 x 0.2 cm   50% slough and 50% granulating tissue present.  Right heel proximal wound stage III: 2.6 x 1.5 x 0.2 cm  50% slough and 50% granulating tissue present.  Left great toe : 0.5 x 0.7 cm slough with eschar in center     ASSESSMENT/ PLAN:  1. Will continue his current treatments to his wounds at this time. Will continue to monitor his status. Will continue his cipro started on 07-04-13 pending bone scan and will continue to monitor his status.

## 2013-07-15 ENCOUNTER — Inpatient Hospital Stay (HOSPITAL_COMMUNITY): Payer: Medicare Other

## 2013-07-15 DIAGNOSIS — I059 Rheumatic mitral valve disease, unspecified: Secondary | ICD-10-CM

## 2013-07-15 LAB — CBC WITH DIFFERENTIAL/PLATELET
BASOS PCT: 0.2 %
Basophil #: 0 10*3/uL (ref 0.0–0.1)
EOS ABS: 0 10*3/uL (ref 0.0–0.7)
Eosinophil %: 0.1 %
HCT: 41.7 % (ref 40.0–52.0)
HGB: 13.8 g/dL (ref 13.0–18.0)
Lymphocyte #: 0.4 10*3/uL — ABNORMAL LOW (ref 1.0–3.6)
Lymphocyte %: 2.8 %
MCH: 29.4 pg (ref 26.0–34.0)
MCHC: 33 g/dL (ref 32.0–36.0)
MCV: 89 fL (ref 80–100)
MONO ABS: 0.3 x10 3/mm (ref 0.2–1.0)
Monocyte %: 2.2 %
Neutrophil #: 13 10*3/uL — ABNORMAL HIGH (ref 1.4–6.5)
Neutrophil %: 94.7 %
PLATELETS: 295 10*3/uL (ref 150–440)
RBC: 4.67 10*6/uL (ref 4.40–5.90)
RDW: 17.2 % — ABNORMAL HIGH (ref 11.5–14.5)
WBC: 13.7 10*3/uL — ABNORMAL HIGH (ref 3.8–10.6)

## 2013-07-15 LAB — CBC
HCT: 33.3 % — ABNORMAL LOW (ref 39.0–52.0)
HEMOGLOBIN: 11.6 g/dL — AB (ref 13.0–17.0)
MCH: 28.7 pg (ref 26.0–34.0)
MCHC: 34.8 g/dL (ref 30.0–36.0)
MCV: 82.4 fL (ref 78.0–100.0)
PLATELETS: 132 10*3/uL — AB (ref 150–400)
RBC: 4.04 MIL/uL — AB (ref 4.22–5.81)
RDW: 16 % — ABNORMAL HIGH (ref 11.5–15.5)
WBC: 7.2 10*3/uL (ref 4.0–10.5)

## 2013-07-15 LAB — BASIC METABOLIC PANEL
BUN: 26 mg/dL — ABNORMAL HIGH (ref 6–23)
CO2: 19 meq/L (ref 19–32)
Calcium: 8 mg/dL — ABNORMAL LOW (ref 8.4–10.5)
Chloride: 106 mEq/L (ref 96–112)
Creatinine, Ser: 0.99 mg/dL (ref 0.50–1.35)
GFR calc Af Amer: 82 mL/min — ABNORMAL LOW (ref >90)
GFR calc non Af Amer: 71 mL/min — ABNORMAL LOW (ref >90)
Glucose, Bld: 64 mg/dL — ABNORMAL LOW (ref 70–99)
POTASSIUM: 3.9 meq/L (ref 3.7–5.3)
SODIUM: 139 meq/L (ref 137–147)

## 2013-07-15 LAB — AMMONIA: AMMONIA: 30 umol/L (ref 11–60)

## 2013-07-15 MED ORDER — FLUCONAZOLE IN SODIUM CHLORIDE 200-0.9 MG/100ML-% IV SOLN
200.0000 mg | INTRAVENOUS | Status: DC
  Administered 2013-07-15: 200 mg via INTRAVENOUS
  Filled 2013-07-15 (×2): qty 100

## 2013-07-15 MED ORDER — MORPHINE SULFATE 2 MG/ML IJ SOLN: 2.0000 mg | INTRAMUSCULAR | Status: DC | PRN

## 2013-07-15 MED ORDER — DILTIAZEM HCL 25 MG/5ML IV SOLN
10.0000 mg | INTRAVENOUS | Status: DC | PRN
  Filled 2013-07-15 (×2): qty 5

## 2013-07-15 MED ORDER — VALPROATE SODIUM 500 MG/5ML IV SOLN
750.0000 mg | Freq: Three times a day (TID) | INTRAVENOUS | Status: DC
  Administered 2013-07-15 – 2013-07-17 (×6): 750 mg via INTRAVENOUS
  Filled 2013-07-15 (×10): qty 7.5

## 2013-07-15 NOTE — Progress Notes (Signed)
Subjective: Continues to be confused, though episodes of jerkign are less.   Exam: Filed Vitals:   07/15/13 1352  BP: 145/70  Pulse: 93  Temp: 97.6 F (36.4 C)  Resp: 20   Gen: In bed, mouth open, appears in mild respiratory distress.  MS: awakens to voice. Follows command to squeeze hands. AO:ZHYQMCN:PERRL, EOMI Motor: follows commands in bilateral hands Sensory:responsd to nox stim.    Impression: 80 yo M with candidemia, hypoxia 2/2 pna, UTI, aki, underlying dementia. It is not clear if these episodes represents seizure or not, the EEG does nto show status epilepticus. Micafungin can cause delirium as well as seizures according to prescribing information and given the cahnge correlated temporally to this medicine, would change this. I will increase depakote to 750mg  TID and check a level tomorrow.   Recommendations: 1)Depakote 750mg  tid 2) depaktoe level in AM 3) I have discussed changing micafungin to another agent the the IM team.  4) agree with palliative consult to address how aggressive to be(i.e. Antibiotics for this or future infections?) 5) will continue to follow.   Ritta SlotMcNeill Leondro Coryell, MD [facility] [phone]  If 7pm- 7am, please page neurology on call as listed in AMION.

## 2013-07-15 NOTE — Procedures (Signed)
History: 80 yo M with a history of dementia now with AMS and intermittent episodes of shaking.   Sedation: In bed, NAD  Technique: This is a 17 channel routine scalp EEG performed at the bedside with bipolar and monopolar montages arranged in accordance to the international 10/20 system of electrode placement. One channel was dedicated to EKG recording.    Background: The background is diffusely slow with delta activity. There are occasional runs of beta range activity consistent with sleep spindles. These are seen slightly better on the right than left, but are bilateral. There is a pucity of other faster frequencies.  Photic stimulation: Physiologic driving is absent  EEG Abnormalities: 1) Generalized delta activity  Clinical Interpretation: This EEG is consistent with a generalized non-specific cerebral dysfunction(encephalopathy). There was no seizure or seizure predisposition recorded on this study.   Ritta SlotMcNeill Isrrael Fluckiger, MD [facility] [phone]  If 7pm- 7am, please page neurology on call as listed in AMION.

## 2013-07-15 NOTE — Progress Notes (Signed)
Paged MD floor cover about pt status. Pt exhibiting shaking movements every 45 mins. Pt is lethargic and unable to follow commands. Pt cardiac rhythm is bizarre. RN unable to give PO medications d/t pt status. MD floor cover called back RN and said to continually monitor and update pt status. Will continue to monitor.

## 2013-07-15 NOTE — Progress Notes (Signed)
EEG Completed; Results Pending  

## 2013-07-15 NOTE — Progress Notes (Signed)
Thank you for consulting the Palliative Medicine Team at [facility] to meet your patient's and family's needs.   The reason that you asked us to see your patient is for GOC and options.   We have scheduled your patient for a meeting:  3/25 0830 am  The Surrogate decision make is: Penny PiaDavid Hush Contact information: [phone]  Other family members that need to be present: granddaughter Aggie Cosierheresa ([name]'s daughter) if able   Your patient is able/unable to participate: unable  Yong ChannelAlicia Granvel Proudfoot, NP Palliative Medicine Team Pager # [phone] (M-F 8a-5p) Team Phone # [phone] (Nights/Weekends)

## 2013-07-15 NOTE — Progress Notes (Signed)
Echocardiogram 2D Echocardiogram has been performed.  Dorothey BasemanReel, Artha Stavros M 07/15/2013, 10:22 AM

## 2013-07-15 NOTE — Progress Notes (Signed)
SLP Cancellation Note  Patient Details Name: Ailene RavelFred Septer MRN: [id number] DOB: Feb 09, 1924   Cancelled treatment:       Reason Eval/Treat Not Completed: Patient's level of consciousness.  Pt not arousable to participate.  Daughter present; states he has been sleeping heavily all morning.  Will f/u next date for improvements.   Blenda MountsCouture, Curry Seefeldt Laurice 07/15/2013, 11:48 AM

## 2013-07-15 NOTE — Progress Notes (Signed)
PT Cancellation Note  Patient Details Name: Matthew RavelFred Floyd MRN: [id number] DOB: March 12, 1924   Cancelled Treatment:    Reason Eval/Treat Not Completed: Lethargy limiting ability to participate   [facility] Hospital[facility]YCOCK,[name] 07/15/2013, 1:47 PM  [name] PT [phone]

## 2013-07-15 NOTE — Progress Notes (Signed)
Patient ID: Matthew Floyd, male   DOB: [birth year], 80 y.o.   MRN: [id number] [facility] PROGRESS NOTE  Matthew Floyd WUJ:[id number] DOB: Jun 11, 1923 DOA: 07/02/2013 PCP: Oneal Grout, MD  Brief narrative:  80 y.o. male with COPD, atrial fibrillation on Xarelto, HTN, pressure heal ulcer, who resides at [facility], presented to [facility] ED after noted to be more confused and with poor oral intake, dyspnea with exertion and at rest also noted, with productive cough of yellow sputum. In ED, CXR worrisome for PNA and [facility] asked to admit for further evaluation.   Active Problems:  Acute encephalopathy  - sudden deterioration in mental status 3/23, ? Failure to respond to medical therapy and / seizures - pt minimally responsive this AM, unable to take PO - currently on Fluconazole for yeast in blood culture (1/2), urinary tract infection determined to be the source  - per family, comfort is desired and palliative care team was consulted  - appreciate neurology input, pt was placed on Depakote for possible seizures  Acute hypoxic respiratory failure  - appears to be secondary to PNA, pt initially started on Vancomycin and Aztreonam for 2 days - this was changed to Levaquin but since it lowers the seizure threshold, we had to stop it for now (stopped 3/24) - we discussed with family placing back on broad spectrum ABX but family wants pt to be comfortable  - palliative care consulted  - continue BD's scheduled and as needed  Bacteremia  - possible urinary source  - placed on Micafungin initially but changed to Fluconazole due to potential seizures  - TTE negative for vegetations  Atrial fibrillation - pt not taking any PO - placed on Cardizem as needed for HR > 115  - also on Metoprolol as needed  UTI  - repeat urine culture for better growth, still pending  - continue fluconazole for now ? Seizure activity  - appreciate neurology assistance - continue Depakote  - awaiting palliative care  team input  AF (atrial fibrillation)  - rate controlled  - continue Xarelto  COPD (chronic obstructive pulmonary disease)  - clinically compensated, maintaining oxygen saturations at target range  - breathing is more agonal this AM  - continue BD's, ABX as noted above  Essential hypertension, benign  - reasonable inpatient control  Pressure ulcer, heel, right, unstageable  - wound care consult  AKI (acute kidney injury)  - likley pre renal from dehydration and poor oral intake  - Cr is trending down and is WNL this AM  Severe malnutrition  - secondary to acute on chronic illness  - now less responsive and not taking any PO  Consultants:  ID  Neurology  PCT Procedures/Studies:  CXR 07/01/2013 COPD changes with increased right perihilar and basilar markings could represent asymmetric edema though difficult to exclude right basilar PNA.  Dg Abd Portable 07/02/2013 No acute abdominal abnormality. Large stool burden.  Antibiotics:  Vancomycin 3/21 --> 3/23 Aztreonam 3/21 --> 3/23 Micafungin 3/23 --> 3/24 Fluconazole 3/24 -->   Code Status: DNR  Family Communication: Daughter at bedside  Disposition Plan: Inpatient   HPI/Subjective: No events overnight.   Objective: Filed Vitals:   07/14/13 1349 07/14/13 2129 07/15/13 0507 07/15/13 1352  BP: 122/90 125/94 125/79 145/70  Pulse: 128 153 100 93  Temp: 97.7 F (36.5 C) 97.9 F (36.6 C) 97.8 F (36.6 C) 97.6 F (36.4 C)  TempSrc: Axillary Axillary Axillary Axillary  Resp: 24 16 18 20   Height:  Weight:      SpO2: 93% 93% 93% 95%    Intake/Output Summary (Last 24 hours) at 07/15/13 1830 Last data filed at 07/15/13 5409  Gross per 24 hour  Intake      0 ml  Output    800 ml  Net   -800 ml    Exam:   General:  Pt is lethargic and minimally responsive with agonal breathing   Cardiovascular: irregular rate and rhythm, no JVD  Respiratory: Poor inspiratory effort, diminished breath sounds at bases   Abdomen:  Soft, non tender, non distended, bowel sounds present, no guarding  Extremities: No edema, pulses DP and PT palpable bilaterally  Data Reviewed: Basic Metabolic Panel:  Recent Labs Lab 08-10-13 1705 07/13/13 0140 07/14/13 0455 07/15/13 0403  NA 130* 133* 136* 139  K 4.5 4.2 3.8 3.9  CL 95* 100 103 106  CO2 20 19 21 19   GLUCOSE 88 93 80 64*  BUN 54* 51* 36* 26*  CREATININE 1.64* 1.50* 1.13 0.99  CALCIUM 7.9* 7.7* 7.9* 8.0*  MG  --   --  1.9  --    Liver Function Tests:  Recent Labs Lab 07/13/13 0140 07/14/13 0455  AST 32 27  ALT 24 21  ALKPHOS 177* 212*  BILITOT 0.5 0.5  PROT 4.8* 5.0*  ALBUMIN 2.2* 2.3*    Recent Labs Lab 07/15/13 1225  AMMONIA 30   CBC:  Recent Labs Lab 08-10-2013 1705 07/13/13 0140 07/13/13 1724 07/14/13 0455 07/15/13 0403  WBC 5.1 3.7* 5.1 5.5 7.2  NEUTROABS 4.2  --   --  4.0  --   HGB 11.7* 11.1* 12.1* 11.6* 11.6*  HCT 34.4* 31.8* 34.4* 32.9* 33.3*  MCV 83.9 83.0 82.9 82.3 82.4  PLT 164 149* 142* 136* 132*    Recent Results (from the past 240 hour(s))  CULTURE, BLOOD (ROUTINE X 2)     Status: None   Collection Time    2013/08/10  4:25 PM      Result Value Ref Range Status   Specimen Description BLOOD BLOOD RIGHT FOREARM   Final   Special Requests BOTTLES DRAWN AEROBIC AND ANAEROBIC 5CC   Final   Culture  Setup Time     Final   Value: 07/13/2013 00:10     Performed at [facility]   Culture     Final   Value:        BLOOD CULTURE RECEIVED NO GROWTH TO DATE CULTURE WILL BE HELD FOR 5 DAYS BEFORE ISSUING A FINAL NEGATIVE REPORT     Performed at [facility]   Report Status PENDING   Incomplete  CULTURE, BLOOD (ROUTINE X 2)     Status: None   Collection Time    08/10/13  4:29 PM      Result Value Ref Range Status   Specimen Description BLOOD BLOOD RIGHT FOREARM   Final   Special Requests BOTTLES DRAWN AEROBIC AND ANAEROBIC 10 CC   Final   Culture  Setup Time     Final   Value: 07/13/2013 00:10     Performed  at [facility]   Culture     Final   Value: YEAST     Note: Gram Stain Report Called to,Read Back By and Verified With: KATIE B @ 1354 07/14/13 BY KRAWS     Performed at [facility]   Report Status PENDING   Incomplete  URINE CULTURE     Status: None   Collection  Time    01/28/2014  7:24 PM      Result Value Ref Range Status   Specimen Description URINE, RANDOM   Final   Special Requests NONE   Final   Culture  Setup Time     Final   Value: 07/13/2013 00:06     Performed at [facility]   Colony Count     Final   Value: >=100,000 COLONIES/ML     [facility]formed at [facility]   Culture     Final   Value: Multiple bacterial morphotypes present, none predominant. Suggest appropriate recollection if clinically indicated.     Performed at [facility]   Report Status 07/13/2013 FINAL   Final  MRSA PCR SCREENING     Status: None   Collection Time    07/14/13  8:45 PM      Result Value Ref Range Status   MRSA by PCR NEGATIVE  NEGATIVE Final   Comment:            The GeneXpert MRSA Assay (FDA     approved for NASAL specimens     only), is one component of a     comprehensive MRSA colonization     surveillance program. It is not     intended to diagnose MRSA     infection nor to guide or     monitor treatment for     MRSA infections.    Scheduled Meds: . famotidine  20 mg Oral Daily  . fluconazole IV  200 mg Intravenous Q24H  . guaiFENesin  600 mg Oral BID  . haloperidol lactate  2 mg Intravenous QID  . LORazepam  0.5 mg Intravenous Once  . memantine  5 mg Oral BID  . nicotine  21 mg Transdermal Daily  . Rivaroxaban  15 mg Oral Q supper  . senna-docusate  1 tablet Oral BID  . tamsulosin  0.4 mg Oral Daily  . valproate sodium  750 mg Intravenous 3 times per day   Continuous Infusions:  Matthew PrestoMAGICK-Matthew Bohorquez, MD  [facility] Pager [phone]  If 7PM-7AM, please contact night-coverage [email or web address] Password [email or web address] 07/15/2013, 6:30 PM   LOS: 3 days

## 2013-07-16 DIAGNOSIS — F0390 Unspecified dementia without behavioral disturbance: Secondary | ICD-10-CM

## 2013-07-16 DIAGNOSIS — Z515 Encounter for palliative care: Secondary | ICD-10-CM

## 2013-07-16 DIAGNOSIS — Z66 Do not resuscitate: Secondary | ICD-10-CM

## 2013-07-16 LAB — URINE CULTURE: Colony Count: >=100000

## 2013-07-16 LAB — CORTISOL: CORTISOL PLASMA: 14.6 ug/dL

## 2013-07-16 MED ORDER — BISACODYL 10 MG RE SUPP: 10.0000 mg | Freq: Every day | RECTAL | Status: DC | PRN

## 2013-07-16 MED ORDER — MORPHINE SULFATE 2 MG/ML IJ SOLN
1.0000 mg | INTRAMUSCULAR | Status: DC | PRN
  Administered 2013-07-17: 1 mg via INTRAVENOUS
  Filled 2013-07-16: qty 1

## 2013-07-16 MED ORDER — SCOPOLAMINE 1 MG/3DAYS TD PT72
1.0000 | MEDICATED_PATCH | TRANSDERMAL | Status: DC
  Administered 2013-07-16: 1.5 mg via TRANSDERMAL
  Filled 2013-07-16: qty 1

## 2013-07-16 MED ORDER — LORAZEPAM 2 MG/ML IJ SOLN: 1.0000 mg | INTRAMUSCULAR | Status: DC | PRN

## 2013-07-16 MED ORDER — ATROPINE SULFATE 1 % OP SOLN
4.0000 [drp] | OPHTHALMIC | Status: DC | PRN
  Administered 2013-07-16: 4 [drp] via SUBLINGUAL
  Filled 2013-07-16 (×2): qty 2

## 2013-07-16 NOTE — Progress Notes (Signed)
[facility] PROGRESS NOTE  Matthew Floyd OZH:[id number] DOB: Sep 02, 1923 DOA: 07/16/2013 PCP: Oneal Grout, MD  Assessment/Plan: Brief narrative:  79 y.o. male with COPD, atrial fibrillation on Xarelto, HTN, pressure heal ulcer, who resides at [facility], presented to [facility] ED after noted to be more confused and with poor oral intake, dyspnea with exertion and at rest also noted, with productive cough of yellow sputum. In ED, CXR worrisome for PNA found to have fungemia, UTI, respiratory failure   1. Acute encephalopathy multifactor due to infection+seizures  -not improving'; per family, comfort care  2. Sepsis, Bacteremia, PNA, UTI -not improving; per family cont comfort care  3. COPD, acute respiratory failure due to PNA; cont comfort care  4. Pressure ulcer, heel, right, unstageable  5. AKI (acute kidney injury), Severe malnutrition   Prognosis is poor; patient is DNR; appreciate palliative care input  -per family comfort care   Consultants:  ID  Neurology  PCT Procedures/Studies:  CXR 07/16/2013 COPD changes with increased right perihilar and basilar markings could represent asymmetric edema though difficult to exclude right basilar PNA.  Dg Abd Portable 07/07/2013 No acute abdominal abnormality. Large stool burden.  Antibiotics:  Vancomycin 3/21 --> 3/23  Aztreonam 3/21 --> 3/23  Micafungin 3/23 --> 3/24  Fluconazole 3/24 -->  Code Status: DNR  Family Communication: Daughter at bedside  Disposition Plan: Inpatient   HPI/Subjective: Lethargic   Objective: Filed Vitals:   07/16/13 1050  BP: 122/84  Pulse: 97  Temp: 97.8 F (36.6 C)  Resp: 18    Intake/Output Summary (Last 24 hours) at 07/16/13 1134 Last data filed at 07/16/13 0820  Gross per 24 hour  Intake  277.5 ml  Output   2225 ml  Net -1947.5 ml   Filed Weights   07/19/2013 1618 07/13/13 0010  Weight: 63.504 kg (140 lb) 67.9 kg (149 lb 11.1 oz)    Exam:   General:  leathargic    Cardiovascular: s1,s2 rrr  Respiratory: BL rales   Abdomen: soft, nt  Musculoskeletal: no LE edea   Data Reviewed: Basic Metabolic Panel:  Recent Labs Lab 07/14/2013 1705 07/13/13 0140 07/14/13 0455 07/15/13 0403  NA 130* 133* 136* 139  K 4.5 4.2 3.8 3.9  CL 95* 100 103 106  CO2 20 19 21 19   GLUCOSE 88 93 80 64*  BUN 54* 51* 36* 26*  CREATININE 1.64* 1.50* 1.13 0.99  CALCIUM 7.9* 7.7* 7.9* 8.0*  MG  --   --  1.9  --    Liver Function Tests:  Recent Labs Lab 07/13/13 0140 07/14/13 0455  AST 32 27  ALT 24 21  ALKPHOS 177* 212*  BILITOT 0.5 0.5  PROT 4.8* 5.0*  ALBUMIN 2.2* 2.3*   No results found for this basename: LIPASE, AMYLASE,  in the last 168 hours  Recent Labs Lab 07/15/13 1225  AMMONIA 30   CBC:  Recent Labs Lab 07/15/2013 1705 07/13/13 0140 07/13/13 1724 07/14/13 0455 07/15/13 0403  WBC 5.1 3.7* 5.1 5.5 7.2  NEUTROABS 4.2  --   --  4.0  --   HGB 11.7* 11.1* 12.1* 11.6* 11.6*  HCT 34.4* 31.8* 34.4* 32.9* 33.3*  MCV 83.9 83.0 82.9 82.3 82.4  PLT 164 149* 142* 136* 132*   Cardiac Enzymes: No results found for this basename: CKTOTAL, CKMB, CKMBINDEX, TROPONINI,  in the last 168 hours BNP (last 3 results) No results found for this basename: PROBNP,  in the last 8760 hours CBG: No results found for this basename:  GLUCAP,  in the last 168 hours  Recent Results (from the past 240 hour(s))  CULTURE, BLOOD (ROUTINE X 2)     Status: None   Collection Time    08/09/13  4:25 PM      Result Value Ref Range Status   Specimen Description BLOOD BLOOD RIGHT FOREARM   Final   Special Requests BOTTLES DRAWN AEROBIC AND ANAEROBIC 5CC   Final   Culture  Setup Time     Final   Value: 07/13/2013 00:10     Performed at [facility]   Culture     Final   Value:        BLOOD CULTURE RECEIVED NO GROWTH TO DATE CULTURE WILL BE HELD FOR 5 DAYS BEFORE ISSUING A FINAL NEGATIVE REPORT     Performed at [facility]   Report Status PENDING    Incomplete  CULTURE, BLOOD (ROUTINE X 2)     Status: None   Collection Time    08-09-13  4:29 PM      Result Value Ref Range Status   Specimen Description BLOOD BLOOD RIGHT FOREARM   Final   Special Requests BOTTLES DRAWN AEROBIC AND ANAEROBIC 10 CC   Final   Culture  Setup Time     Final   Value: 07/13/2013 00:10     Performed at [facility]   Culture     Final   Value: YEAST     Note: Gram Stain Report Called to,Read Back By and Verified With: KATIE B @ 1354 07/14/13 BY KRAWS     Performed at [facility]   Report Status PENDING   Incomplete  URINE CULTURE     Status: None   Collection Time    08-09-2013  7:24 PM      Result Value Ref Range Status   Specimen Description URINE, RANDOM   Final   Special Requests NONE   Final   Culture  Setup Time     Final   Value: 07/13/2013 00:06     Performed at [facility] Count     Final   Value: >=100,000 COLONIES/ML     Performed at [facility]   Culture     Final   Value: Multiple bacterial morphotypes present, none predominant. Suggest appropriate recollection if clinically indicated.     Performed at [facility]   Report Status 07/13/2013 FINAL   Final  URINE CULTURE     Status: None   Collection Time    07/14/13  8:45 PM      Result Value Ref Range Status   Specimen Description URINE, RANDOM   Final   Special Requests NONE   Final   Culture  Setup Time     Final   Value: 07/15/2013 03:15     Performed at [facility]   Colony Count PENDING   Incomplete   Culture     Final   Value: Culture reincubated for better growth     Performed at [facility]   Report Status PENDING   Incomplete  MRSA PCR SCREENING     Status: None   Collection Time    07/14/13  8:45 PM      Result Value Ref Range Status   MRSA by PCR NEGATIVE  NEGATIVE Final   Comment:            The GeneXpert MRSA Assay (FDA     approved  for NASAL specimens     only), is one component of a      comprehensive MRSA colonization     surveillance program. It is not     intended to diagnose MRSA     infection nor to guide or     monitor treatment for     MRSA infections.  CULTURE, BLOOD (ROUTINE X 2)     Status: None   Collection Time    07/15/13  4:03 AM      Result Value Ref Range Status   Specimen Description BLOOD LEFT HAND   Final   Special Requests BOTTLES DRAWN AEROBIC ONLY 5CC   Final   Culture  Setup Time     Final   Value: 07/15/2013 08:19     Performed at [facility]   Culture     Final   Value:        BLOOD CULTURE RECEIVED NO GROWTH TO DATE CULTURE WILL BE HELD FOR 5 DAYS BEFORE ISSUING A FINAL NEGATIVE REPORT     Performed at [facility]   Report Status PENDING   Incomplete  CULTURE, BLOOD (ROUTINE X 2)     Status: None   Collection Time    07/15/13  4:07 AM      Result Value Ref Range Status   Specimen Description BLOOD RIGHT HAND   Final   Special Requests BOTTLES DRAWN AEROBIC AND ANAEROBIC 10CC EACH   Final   Culture  Setup Time     Final   Value: 07/15/2013 08:19     Performed at [facility]   Culture     Final   Value:        BLOOD CULTURE RECEIVED NO GROWTH TO DATE CULTURE WILL BE HELD FOR 5 DAYS BEFORE ISSUING A FINAL NEGATIVE REPORT     Performed at [facility]   Report Status PENDING   Incomplete     Studies: Ct Head Wo Contrast  07/14/2013   CLINICAL DATA:  Altered mental status. Dementia. 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  EXAM: CT HEAD WITHOUT CONTRAST  TECHNIQUE: Contiguous axial images were obtained from the base of the skull through the vertex without intravenous contrast.  COMPARISON:  None.  FINDINGS: Exam is motion degraded.  No obvious intracranial hemorrhage or CT evidence of large acute infarct.  Small vessel disease type changes.  Global atrophy without hydrocephalus.  No intracranial mass lesion noted on this unenhanced exam.  IMPRESSION: Motion degraded examination without evidence of  intracranial hemorrhage or large acute infarct.  Please see above   Electronically Signed   By: Bridgett Larsson M.D.   On: 07/14/2013 20:14    Scheduled Meds: . collagenase  1 application Topical Daily  . feeding supplement (ENSURE)  1 Container Oral TID BM  . haloperidol lactate  2 mg Intravenous QID  . nicotine  21 mg Transdermal Daily  . scopolamine  1 patch Transdermal Q72H  . sodium chloride  3 mL Intravenous Q12H  . valproate sodium  750 mg Intravenous 3 times per day   Continuous Infusions:   Active Problems:   AF (atrial fibrillation)   COPD (chronic obstructive pulmonary disease)   Essential hypertension, benign   Pressure ulcer, heel, right, unstageable   AKI (acute kidney injury)   UTI (urinary tract infection)   Encephalopathy acute   Healthcare-associated pneumonia   Dehydration   Pneumonia    Time spent: >35    Emmanuella Mirante, Isaiah Serge  [facility] Pager [phone]. If 7PM-7AM, please contact night-coverage at [email or web address], password [email or web address] 07/16/2013, 11:34 AM  LOS: 4 days

## 2013-07-16 NOTE — Progress Notes (Signed)
Per nursing, patient now comfort care.  Will not follow up unless needed.

## 2013-07-16 NOTE — Progress Notes (Signed)
Subjective: After palliative care meeting today, patient was made full comfort care. Given that his baseline was fairly impaired dementia I think this is a reasonable decision. His recurrent episodes  concerning but not definite for seizure have improved.   Exam: Filed Vitals:   07/16/13 1050  BP: 122/84  Pulse: 97  Temp: 97.8 F (36.6 C)  Resp: 18   Gen: In bed, appears comfortable   Impression: 80 yo M with candidemia, hypoxia 2/2 pna, UTI, aki, underlying dementia. It is not clear if these episodes represents seizure or not, the EEG does nto show status epilepticus.  I would favor continuing dpeakote while receiving IV medications. If able to take PO, could use this PO after IVs are removed, if not then If spells recurr after stopping, could try ativan for treatment.   Recommendations: 1) Continue depakote if possible, if not could use sublingual ativan if needed.  2) Neurology will sign off at thsi time, please call if there are any further questions.   Ritta SlotMcNeill Kirkpatrick, MD [facility] [phone]  If 7pm- 7am, please page neurology on call as listed in AMION.

## 2013-07-16 NOTE — Consult Note (Signed)
Patient Matthew Floyd      DOB: [birth year]      UVO:[id number]     Consult Note from the Palliative Medicine Team at [place] Requested by: Dr. Doyle Askew     PCP: Blanchie Serve, MD Reason for Consultation: [place] and options    Phone Number:[phone]  Assessment of patients Current state: Matthew Floyd is a 80 yo male admitted 07/04/2013 with altered mental status from [facility]. He has been treated for pneumonia, UTI, blood cultures + yeast, and now seizures. He has PMH of atrial fibrillation, COPD, dementia, and right heel pressure ulcer. He has continued to decline despite aggressive medical treatment.   I met today with his son Shanon Brow [phone]) and [name]'s daughter Helene Kelp [phone]). They tell me that he has been wheelchair bound (but ambulates with PT) since his hip fracture the week of Christmas. They say that he has good days where he is able to converse appropriately with them and bad days when he is agitated and unable to interact d/t his dementia. They say last Thursday at [facility] staff and family noticed he was clammy, tachycardic, hypertensive, and less responsive Helene Kelp says she that thought he might be dying then) but then was improved and more interactive Friday before taking a turn for worse Saturday when he was minimally responsive and they chose to send him to the ED. Shanon Brow tells me that they have thought and discussed with the medical team and as a family that continuing antibiotics might cause more harm than benefit (risk seizures) and we also discussed prolonging his suffering. At this time Shanon Brow decides that we should focus solely on comfort and making sure that Matthew Floyd doesn't suffer - Helene Kelp is tearful but agrees with comfort approach. I will continue to follow and support Matthew Floyd and his family.    Goals of Care: 1.  Code Status: DNR   2. Scope of Treatment: 1. Vital Signs: daily 2. Respiratory/Oxygen: for comfort 3. Nutritional Support/Tube  Feeds: no 4. Antibiotics: no 5. Blood Products: no 6. IVF: no 7. Review of Medications to be discontinued: Minimize for comfort. 8. Labs: no 9. Telemetry: no   4. Disposition: Consider GIP.    3. Symptom Management:   1. Anxiety/Agitation: Ativan prn.  2. Pain: Morphine prn.  3. Bowel Regimen: Dulcolax supp prn.  4. Fever: Acetaminophen prn.  5. Nausea/Vomiting: Ondansetron prn.  6. Terminal Secretions: Scopolamine patch and atropine SL prn.   4. Psychosocial: Emotional support provided during difficult conversation.   5. Spiritual: Support provided from personal pastor. Shanon Brow called him after our meeting.    Brief HPI: 80 yo male admitted 06/27/2013 with altered mental status from [facility]. He has been treated for pneumonia, UTI, blood cultures + yeast, and now seizures. He has PMH of atrial fibrillation, COPD, dementia, and right heel pressure ulcer. He has continued to decline despite aggressive medical treatment.    ROS: Unable to elicit - unresponsive/encephalopathy.     PMH:  Past Medical History  Diagnosis Date  . A-fib   . Pacemaker -Biotronik   . CHF (congestive heart failure)     EF 50% on echo 2013, 44% on nuclear stufy 07/2011  . Anemia, pernicious   . Arthritis   . Back pain   . Cerebral aneurysm   . COPD (chronic obstructive pulmonary disease)   . DDD (degenerative disc disease)   . GERD (gastroesophageal reflux disease)     h/o peptic ulcer  . HTN (  hypertension)   . Subdural hematoma   . History of skin cancer   . Cigarette smoker   . Valvular disease     MR/TR on echo prev  . Dementia   . MI (myocardial infarction)   . BPH (benign prostatic hyperplasia)   . CAD (coronary artery disease)     s/p stent placement  . Enlarged prostate   . Dementia   . Fall at home   . Ulcer     right heel  . Broken hip   . UTI (urinary tract infection)      PSH: Past Surgical History  Procedure Laterality Date  . Finger amputation      traumatic  .  Angioplasty    . Hernia repair    . Burr hole for subdural hematoma      with evacuation  . Pacemaker placement      biotronik 10/12/09  . Partial gastrectomy    . Cholecystectomy    . Cardiac catheterization    . Coronary angioplasty    . Insert / replace / remove pacemaker    . Orif right hip    . Hip surgery     I have reviewed the FH and SH and  If appropriate update it with new information. Allergies  Allergen Reactions  . Flecainide Other (See Comments)    H/o CAD  . Codeine Other (See Comments)    Per MAR  . Morphine And Related Other (See Comments)    Per MAR  . Multaq [Dronedarone] Other (See Comments)    Per MAR  . Penicillins Other (See Comments)   Scheduled Meds: . collagenase  1 application Topical Daily  . feeding supplement (ENSURE)  1 Container Oral TID BM  . haloperidol lactate  2 mg Intravenous QID  . nicotine  21 mg Transdermal Daily  . scopolamine  1 patch Transdermal Q72H  . sodium chloride  3 mL Intravenous Q12H  . valproate sodium  750 mg Intravenous 3 times per day   Continuous Infusions:  PRN Meds:.acetaminophen, atropine, bisacodyl, diltiazem, levalbuterol, LORazepam, metoprolol, morphine injection, ondansetron (ZOFRAN) IV, RESOURCE THICKENUP CLEAR    BP 127/80  Pulse 98  Temp(Src) 97.8 F (36.6 C) (Oral)  Resp 20  Ht 6' (1.829 m)  Wt 67.9 kg (149 lb 11.1 oz)  BMI 20.30 kg/m2  SpO2 97%   PPS: 10%   Intake/Output Summary (Last 24 hours) at 07/16/13 0945 Last data filed at 07/16/13 0820  Gross per 24 hour  Intake  277.5 ml  Output   2225 ml  Net -1947.5 ml   LBM: 07/16/13                        Physical Exam:  General: Unresponsive, NAD HEENT: + severe temporal muscle wasting, relaxed jaw, moderate oral secretions Chest: Rales throughout CVS: Irreg, S1 S2 Abdomen: Soft, NT, ND, +BS Ext: Warm to touch, no edema Neuro: Unresponsive, unable to follow commands  Labs: CBC    Component Value Date/Time   WBC 7.2 07/15/2013  0403   WBC 5.0 06/17/2013   RBC 4.04* 07/15/2013 0403   HGB 11.6* 07/15/2013 0403   HCT 33.3* 07/15/2013 0403   PLT 132* 07/15/2013 0403   MCV 82.4 07/15/2013 0403   MCH 28.7 07/15/2013 0403   MCHC 34.8 07/15/2013 0403   RDW 16.0* 07/15/2013 0403   LYMPHSABS 0.8 07/14/2013 0455   MONOABS 0.6 07/14/2013 0455   EOSABS 0.1 07/14/2013 0455  BASOSABS 0.0 07/14/2013 0455    BMET    Component Value Date/Time   NA 139 07/15/2013 0403   NA 136* 07/02/2013   K 3.9 07/15/2013 0403   CL 106 07/15/2013 0403   CO2 19 07/15/2013 0403   GLUCOSE 64* 07/15/2013 0403   BUN 26* 07/15/2013 0403   BUN 25* 07/02/2013   CREATININE 0.99 07/15/2013 0403   CREATININE 1.0 07/02/2013   CALCIUM 8.0* 07/15/2013 0403   GFRNONAA 71* 07/15/2013 0403   GFRAA 82* 07/15/2013 0403    CMP     Component Value Date/Time   NA 139 07/15/2013 0403   NA 136* 07/02/2013   K 3.9 07/15/2013 0403   CL 106 07/15/2013 0403   CO2 19 07/15/2013 0403   GLUCOSE 64* 07/15/2013 0403   BUN 26* 07/15/2013 0403   BUN 25* 07/02/2013   CREATININE 0.99 07/15/2013 0403   CREATININE 1.0 07/02/2013   CALCIUM 8.0* 07/15/2013 0403   PROT 5.0* 07/14/2013 0455   ALBUMIN 2.3* 07/14/2013 0455   AST 27 07/14/2013 0455   ALT 21 07/14/2013 0455   ALKPHOS 212* 07/14/2013 0455   BILITOT 0.5 07/14/2013 0455   GFRNONAA 71* 07/15/2013 0403   GFRAA 82* 07/15/2013 0403     Time In Time Out Total Time Spent with Patient Total Overall Time  0830 0950 9mn 892m    Greater than 50%  of this time was spent counseling and coordinating care related to the above assessment and plan.  AlVinie SillNP Palliative Medicine Team Pager # [phone]-F 8a-5p) Team Phone # [phone]/Weekends)

## 2013-07-16 NOTE — Care Management Note (Addendum)
    Page 1 of 2   07/18/2013     11:22:51 AM   CARE MANAGEMENT NOTE 07/18/2013  Patient:  Matthew Floyd,Matthew Floyd   Account Number:  [id number]  Date Initiated:  07/16/2013  Documentation initiated by:  Matthew Floyd,Matthew Floyd  Subjective/Objective Assessment:   dx pna, sepsis, encephalopathy, copd  admit- full comfor care.     Action/Plan:   for gip on 3/26   Anticipated DC Date:  07/06/2013   Anticipated DC Plan:  HOSPICE MEDICAL FACILITY      DC Planning Services  CM consult      PAC Choice  HOSPICE   Choice offered to / List presented to:  C-4 Adult Children           HH agency  [facility] [place]   Status of service:  Completed, signed off Medicare Important Message given?   (If response is "NO", the following Medicare IM given date fields will be blank) Date Medicare IM given:   Date Additional Medicare IM given:    Discharge Disposition:  EXPIRED  Per UR Regulation:  Reviewed for med. necessity/level of care/duration of stay  If discussed at Long Length of Stay Meetings, dates discussed:   06/27/2013    Comments:  07/16/13 1512 Matthew Capeeborah Hajime Asfaw RN, BSN [phone] NCM spoke with Matthew Floyd with Palliative and with attending MD, Matthew Floyd was stating to see about making patient gip tomorrow, NCM asked if we could do this today, she stated that attending would need to make that call.  NCM called MD and he stated to make referral for gip and he would like for the palliative team to be the attending.  NCM spoke with patient's daughter and she stated to call Matthew HuaDavid the patient's son, which NCM did and explained to him about gip and he stated [facility], [facility].

## 2013-07-17 ENCOUNTER — Encounter (HOSPITAL_COMMUNITY): Payer: Self-pay | Admitting: Internal Medicine

## 2013-07-17 ENCOUNTER — Inpatient Hospital Stay (HOSPITAL_COMMUNITY): Admission: AD | Admit: 2013-07-17 | Source: Ambulatory Visit | Admitting: Internal Medicine

## 2013-07-17 DIAGNOSIS — G309 Alzheimer's disease, unspecified: Secondary | ICD-10-CM

## 2013-07-17 DIAGNOSIS — F0280 Dementia in other diseases classified elsewhere without behavioral disturbance: Secondary | ICD-10-CM

## 2013-07-17 LAB — CULTURE, BLOOD (ROUTINE X 2)

## 2013-07-17 MED ORDER — MORPHINE SULFATE 2 MG/ML IJ SOLN
2.0000 mg | INTRAMUSCULAR | Status: DC
  Administered 2013-07-17: 2 mg via INTRAVENOUS

## 2013-07-17 MED ORDER — MORPHINE SULFATE 2 MG/ML IJ SOLN
2.0000 mg | INTRAMUSCULAR | Status: DC | PRN
  Filled 2013-07-17: qty 1

## 2013-07-17 MED ORDER — ATROPINE SULFATE 1 % OP SOLN
4.0000 [drp] | OPHTHALMIC | Status: DC
  Administered 2013-07-17: 4 [drp] via SUBLINGUAL
  Filled 2013-07-17: qty 2

## 2013-07-19 LAB — CULTURE, BLOOD (ROUTINE X 2): Culture: NO GROWTH

## 2013-07-20 NOTE — Consult Note (Signed)
I have reviewed this case with our NP and agree with the Assessment and Plan as stated.  Anabela Crayton L. Malyia Moro, MD MBA The Palliative Medicine Team at [place] Team Phone: [phone] Pager: [phone]   

## 2013-07-21 LAB — CULTURE, BLOOD (ROUTINE X 2)
Culture: NO GROWTH
Culture: NO GROWTH

## 2013-07-23 NOTE — Progress Notes (Signed)
[facility] Social Work Note: Received request from [employer] Debra Taylor for evaluation of patient to be admitted to hospice in hospital. [facility] Medical Director with diagnosis of Alzheimer's (331). Family completed paper work between 9 and 10 am today. Forms delivered to admitting at 10:29. RNCM and MDs aware. Thank you. Forrestine Himva Eustace Hur LCSW [phone]

## 2013-07-23 NOTE — Progress Notes (Signed)
Patients family requested to place dentures in patients mouth and glasses on his person for transport to funeral home.

## 2013-07-23 NOTE — Discharge Summary (Addendum)
Death Summary  Matthew RavelFred Floyd ZOX:[id number] DOB: 08-30-1923 DOA: 25-May-2013  PCP: Oneal GroutPANDEY, [name], MD PCP/Office notified:   Admit date: 25-May-2013 Date of Death: 06/27/2013  Final Diagnoses:  Principal Problem:   Comfort measures only status Active Problems:   AF (atrial fibrillation)   COPD (chronic obstructive pulmonary disease)   Essential hypertension, benign   Pressure ulcer, heel, right, unstageable   AKI (acute kidney injury)   UTI (urinary tract infection)   Encephalopathy acute   Healthcare-associated pneumonia   Dehydration   Pneumonia   Palliative care encounter   DNR (do not resuscitate)   Alzheimer's disease      History of present illness:  80 y.o. male with COPD, atrial fibrillation on Xarelto, HTN, pressure heal ulcer, [facility], [facility] after noted to be more confused and with poor oral intake, dyspnea with exertion and at rest also noted, with productive cough of yellow sputum. In ED, CXR worrisome for PNA found to have fungemia, UTI, respiratory failure   Hospital Course:  1. Acute encephalopathy multifactor due to infection+seizures  -not improving'; per family, comfort care  2. Sepsis, Bacteremia, PNA, UTI  -not improving; per family cont comfort care  3. COPD, acute respiratory failure due to PNA; cont comfort care  4. Pressure ulcer, heel, right, unstageable  5. AKI (acute kidney injury), Severe malnutrition   Prognosis is poor; patient is DNR; appreciate palliative care input  -per family comfort care   Patient is admitted under hospice care and died on 07/13/2013; family at the bedside    Patient pronounced dead on 07/15/2013 at 13.00;      Time: .   SignedEsperanza Sheets:  [name]  [facility] 07/10/2013, 2:23 PM     Addendum: likely sepsis was present on admission;  [name]

## 2013-07-23 DEATH — deceased

## 2013-07-28 ENCOUNTER — Ambulatory Visit: Payer: Medicare Other | Admitting: Cardiovascular Disease

## 2014-04-08 ENCOUNTER — Other Ambulatory Visit: Payer: Self-pay | Admitting: Internal Medicine
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
## 2014-08-14 NOTE — H&P (Signed)
PATIENT NAME:  Matthew Floyd, Matthew Floyd MR#:  [id number] DATE OF BIRTH:  06/10/1922  DATE OF ADMISSION:  04/14/2013  REASON FOR ADMISSION: Hip fracture, status post fall.   REFERRING PHYSICIAN: Cecille AmsterdamJonathan E. Mayford KnifeWilliams, MD  PRIMARY CARDIOLOGIST:  Chelsea AusMuhammad A. Kirke CorinArida, MD  PRIMARY CARE PHYSICIAN:  Dwana CurdGraham S. Para Marchuncan, MD  HISTORY OF PRESENT ILLNESS:  This is a very nice 81 year old gentleman who has mild to moderate dementia. He has been living with his son and daughter-in-law for several years after his wife passed. He has a walker but he never uses it because he actually ambulates very well. His son says that he does not have any issues with equilibrium or imbalance, he is actually pretty steady, but today he was walking around around 10:00 a.m., fell down and was not able to get up. they had some help with some people to get him up and bring him into the Emergency Department.  He was in significant pain on his right hip. X-rays were done showing a minimally displaced intertrochanteric right femur fracture for which the patient was admitted for treatment of this condition. Dr. Ernest PineHooten has been called for evaluation of the surgical issues. The patient has a permanent pacemaker. He has coronary artery disease, in the past had an MI but he does not have any angina equivalent. He is able to walk around in the house and outside long distances without any significant chest pain or shortness of breath. He does have a pacemaker and it has been stable and he has been on Xarelto for the past 3 months.  As of today, his INR has not been checked but he took his last Xarelto last night. The patient is admitted for evaluation and treatment of hip fracture. The patient is not able to give any information due to his dementia. Most of the information has been collected by his family.   REVIEW OF SYSTEMS: Unable to obtain due to dementia. The patient is telling me that he does not have any pain on his right leg. He denies any active chest  pain or shortness of breath at this moment.   PAST MEDICAL HISTORY: 1.  Coronary artery disease, status post MI over 10 years ago.  2.  Atrial fibrillation.  3.  Status post permanent pacemaker due to atrial fibrillation.  4.  COPD.   5.  Hypertension.  6.  Peptic ulcer disease, status post stomach anastomosis due to a large ulcer.  7.  Skin cancer.  8.  Dementia.  9.  Status post treatment of UTI, finished 2 days ago with antibiotics.   ALLERGIES: PENICILLIN GIVES HIM HIVES.   PAST SURGICAL HISTORY:  1.  Permanent pacemaker 8 years ago.  2.  Gastric anastomosis due to peptic ulcer disease.   FAMILY HISTORY: MI in his father, who died from it, he cannot tell me what age his father was when he died.  His brother also died from an MI. No cancer in the family. No diabetes. No strokes.   SOCIAL HISTORY: The patient is retired from [employer]. He lives with his son. He is widowed. His wife passed 3 years ago. He does not smoke and does not drink, but he used to smoke before.   MEDICATIONS: Xarelto 20 mg once a day, ranitidine 150 mg twice daily, metoprolol 25 mg take 1/2 tablet 2 times a day, Flomax 0.4 mg once a day, diltiazem 120 mg in 24 hours once a day, digoxin 125 mcg once daily, Aricept 5 mg once a  day.   PHYSICAL EXAMINATION: VITAL SIGNS: Blood pressure 92/48, pulse 70, respirations 20, temperature 97.9.  GENERAL: The patient is alert and he is cooperative, but he is oriented only to person right now, is slightly confused. He is not in any acute distress.  HEENT: Pupils are equal and reactive. Extraocular movements are intact. Mucosa are slightly dry. Anicteric sclerae. Pink conjunctivae. No oral lesions. No oropharyngeal exudates.  NECK: Supple. No JVD. No thyromegaly. No adenopathy. No carotid bruits. No rigidity.  CARDIOVASCULAR: Regular rate and rhythm. No murmurs, rubs or gallops are appreciated at this moment. No displacement of PMI. No tenderness to palpation of the chest  wall.  LUNGS: Clear without any wheezing or crepitus. No use of accessory muscles. No dullness to percussion.  ABDOMEN: Soft, nontender, nondistended. No hepatosplenomegaly. No masses. Bowel sounds are positive.  GENITAL: Negative for external lesions. The patient has a right inguinal hernia which is palpable, easy to reduce. It seems to go into the testicle as well. The patient is not having any pain with palpation.  EXTREMITIES: No edema, cyanosis or clubbing.  VASCULAR: Pulses +2. Capillary refill less than 3. Sensation is normal distally. Neurovascular exam is normal.  .  MUSCULOSKELETAL: Right lower extremity has external rotation of the lower extremity. No significant tenderness on palpation of the hip at this moment. No significant bruising noticed.  NEUROLOGICAL: Cranial nerves II through XII intact. Strength is 5 out of 5 on the upper extremities. His right lower extremity is unable to assess due to the pain and he has full strength on his left lower extremity. Sensation is normal. No focal findings other than mentioned above.  SKIN: No rashes, petechiae or new lesions. The patient has keratotic lesions on the face but none of them look suspicious for cancer.  LYMPHATICS: Negative for lymphadenopathy in the neck or supraclavicular areas.  PSYCHIATRIC: Negative for agitation or depression. The patient is slightly confused, oriented only to person.   LABORATORY, DIAGNOSTIC AND RADIOLOGICAL DATA:  1.  His right hip has a minimally displaced fracture which is intertrochanteric.  2.  Chest x-ray changes related to COPD but no acute disease.  3.  Potassium is 2.9, glucose is 133, chloride 104, CO2 of 29, total protein is 5.6, albumin is 3.1. White count is 8.7, hemoglobin is 13.3, platelet count 216.  4.  EKG: The patient has a pacemaker and there are multiple spikes from the pacemaker that I am not quite sure they are normal or not.  We are going to get cardiology to look at it for investigation  of pacemaker although it seems to be capturing and it seems to be very regular. Wide QRS due to the pacemaker. No significant changes from previous.   ASSESSMENT AND PLAN: This is an 81 year old gentleman with a hip fracture, history of coronary artery disease, atrial fibrillation, chronic obstructive pulmonary disease, hypertension, skin cancer, dementia, admitted for treatment of hip fracture.  1.  Hip fracture, patient has a mechanical fall. He has risk factors for complications due to coronary artery disease and anticoagulation, atrial fibrillation and chronic obstructive pulmonary disease, although all these issues have been well controlled. He does not have any active anginal equivalents like chest pain or shortness of breath. He actually is very active, does exercise and never gets any symptoms out of that. His atrial fibrillation has been controlled with a permanent pacemaker. He is on a beta blocker due that we are going to continue preoperatively. The only problem right now is  that he is on Xarelto and we are not able to reverse that.  I am ordering a stat INR and the patient likely is going to need 72 hours of this medication before proceeding with surgery. I am going to get cardiology to see him since he has a cardiologist, Dr. Kirke Corin, also to look at his EKG due to multiple spikes from the permanent pacemaker that might be normal in his case but I want to see previous EKGs from [facility]'s office. The patient is going to be admitted, is going to have a diet until he is ready for surgery. I am going to put on traction,  put a Foley catheter and treat his pain and anxiety.  2.  Coronary artery disease. Continue beta blocker per preoperatively, hold aspirin for now.  3.  Atrial fibrillation with a permanent pacemaker. Rate is controlled. The patient is anticoagulated for now so he will be good without deep vein thrombosis prophylaxis for the next 3 days.  4.  Chronic obstructive pulmonary disease. The  patient seems to be stable at p.r.n. nebulizers. No signs of acute respiratory illness.  5.  Hypertension, seems to be overall controlled. At this moment it is low, for what we are going to hold blood pressure medications except for the beta blocker. Hold the diltiazem and start it again if his heart rate starts to go up.  6.  Dementia. Prevent delirium. Avoid high doses of narcotics. Recommendations given to the family member. 7.  Status post urinary tract infection. Get a urinalysis to evaluate if it is completely gone.  8.  Peptic ulcer disease. Add Protonix.   TIME SPENT: I spent about 50 minutes with this patient.    ____________________________ Felipa Furnace, MD rsg:cs D: 04/14/2013 13:40:00 ET T: 04/14/2013 14:13:36 ET JOB#: [id number]  cc: Felipa Furnace, MD, <Dictator> Kili Gracy Juanda Chance MD ELECTRONICALLY SIGNED 04/21/2013 0:43

## 2014-08-14 NOTE — Consult Note (Signed)
PATIENT NAME:  Matthew Floyd, Matthew Floyd MR#:  [id number] DATE OF BIRTH:  06/10/1922  DATE OF CONSULTATION:  04/14/2013  REFERRING PHYSICIAN:  Felipa Furnace, MD CONSULTING PHYSICIAN:  Illene Labrador. Angie Fava., MD  CHIEF COMPLAINT: Right hip pain.   HISTORY OF PRESENT ILLNESS: The patient is an 81 year old gentleman who was walking about the house earlier today and fell, landing on his right hip and side. He was unable to stand or bear weight due to the right hip pain. Prior to the fall, he typically ambulated without any ambulatory aids although he apparently does have a walker. He does not recall the particulars of the fall. He denies any loss of consciousness. He denies any other injuries.   PAST MEDICAL HISTORY: Coronary artery disease, status post myocardial infarction, atrial fibrillation, COPD, hypertension, peptic ulcer disease, dementia, recent UTI, status post placement of permanent pacemaker, status post gastric anastomosis due to peptic ulcer disease.   ALLERGIES: PENICILLIN.   MEDICATIONS AT THE TIME OF ADMISSION: Xarelto 20 mg daily, ranitidine 150 mg b.i.d., metoprolol succinate 25 mg half a tablet b.i.d., Flomax 0.4 mg daily, diltiazem 120 mg extended-release 1 capsule daily, digoxin 0.125 mg daily, Aricept 5 mg at bedtime.   SOCIAL HISTORY: The patient currently lives with his son. He is a widower.  No current tobacco or alcohol use although he was a previous smoker. 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   FAMILY HISTORY: Positive for cardiac disease in father and brother.   REVIEW OF SYSTEMS: Unable to obtain complete review of systems due to patient's dementia. He does complain of right hip pain.   PHYSICAL EXAMINATION: GENERAL: Pleasant well-developed, well-nourished gentleman seen in no acute distress in the hospital bed with Buck's traction applied to the right lower extremity.  HEENT: Atraumatic, normocephalic. Extraocular motion is intact.  Oropharynx is clear with moist mucosa.  NECK: Supple and with good range of motion. No appreciable tenderness to palpation.  LUNGS: Clear to auscultation bilaterally.  CARDIAC: Regular rate and rhythm. No appreciable murmurs, gallops or rubs. Pedal pulses are palpable bilaterally. No gross lower extremity edema.  ABDOMEN: Soft, nontender, nondistended.  MUSCULOSKELETAL: Good range of motion, strength and stability to the upper extremities. Examination of lower extremities is notable for findings involving the right lower extremity. The right lower extremity is shortened and externally rotated. Hip pain is reproduced with any attempt at range of motion of the right hip. No gross knee effusion. No tenderness to palpation about the knee. Ankle is nontender to palpation. No gross ecchymosis is appreciated about the hip at the present time.  NEUROLOGIC: Awake, alert, but confused. Sensory function is grossly intact. Motor strength is felt to be 5/5 with the exception of the right lower extremity which was not assessed due to the right hip injury. Good motor coordination is appreciated.  SKIN:  Dressings are applied to the right elbow and to the right knee without any gross drainage.   X-RAYS: Radiographs of the right hip demonstrate a right intertrochanteric femur fracture. Relative osteopenia is appreciated. Reasonably good preservation of the cartilage space is noted.   IMPRESSION: Right intertrochanteric femur fracture.   PLAN:  1.  Findings were discussed with the patient. An attempt was made to contact the patient's son and a message was left on the answering machine.  2.  Would recommend open reduction internal fixation of right intertrochanteric femur fracture. However, the patient is currently anticoagulated with Xarelto. The Xarelto has then held. Discussions with the  anesthesiologist have generated their recommendation for the patient to be off of the Xarelto for at least 3 days before surgical  intervention. Serial prothrombin times have been ordered as per internal medicine.  3.  A cardiology consult has been placed. The patient is also noted to be hypokalemic. Labs have been ordered for the morning.   ____________________________ Illene LabradorJames P. Angie FavaHooten Jr., MD jph:cs D: 04/14/2013 18:40:13 ET T: 04/14/2013 20:27:04 ET JOB#: [id number]  cc: Illene LabradorJames P. Angie FavaHooten Jr., MD, <Dictator> JAMES P Angie FavaHOOTEN JR MD ELECTRONICALLY SIGNED 04/16/2013 7:26

## 2014-08-14 NOTE — Consult Note (Signed)
Brief Consult Note: Diagnosis: Right intertrochanteric femur fracture.   Patient was seen by consultant.   Consult note dictated.   Comments: Recommend ORIF of right intertrochanteric femur fracture. Anesthesiology requests patient be off xarelto at least 3 days. Will follow prothrombin times.  Surgical site signed as per "right site surgery" protocol.   The risks and benefits of surgical intervention were discussed in detail with the patient and his son. They expressed understanding of the risks and benefits and agreed with plans for surgery.  Electronic Signatures: Donato HeinzHooten, James P (MD)  (Signed 22-Dec-14 18:42)  Authored: Brief Consult Note   Last Updated: 22-Dec-14 18:42 by Donato HeinzHooten, James P (MD)

## 2014-08-14 NOTE — Discharge Summary (Signed)
PATIENT NAME:  Ailene RavelGREGORY, Lois MR#:  [id number] DATE OF BIRTH:  06/10/1922  DATE OF ADMISSION:  04/14/2013 DATE OF DISCHARGE:  04/21/2013  He came in after a fall and hip fracture. Because he was on Xarelto for anticoagulation, surgery was delayed until April 18, 2013.   FINAL DIAGNOSES:  1.  Atrial fibrillation with history of sick sinus syndrome and pacer.  2.  History of coronary artery disease.  3.  Right hip fracture, status post repair.  4.  Dementia.  5.  Parkinson's.  6.  Benign prostatic hypertrophy.  7.  Constipation.   MEDICATIONS ON DISCHARGE: Include digoxin 125 mcg 1 tablet daily, Flomax 0.4 mg daily, diltiazem 120 mg every 24 hours extended-release, metoprolol succinate 25 mg 1/2 tablet twice a day, Aricept 5 mg at bedtime, ranitidine 150 mg twice a day, oxycodone 5 mg every 4 hours as needed for pain, tramadol 50 mg every 4 hours as needed for pain, magnesium hydroxide 8% oral suspension 30 mL twice a day as needed for constipation, collagenase at 250 units/g apply topically once a day, bisacodyl 10 mg rectal suppository as needed for constipation, Colace/senna 50/8.6, 1 tablet twice a day and Xarelto 15 mg in the evening.   DIET: Low sodium diet, regular consistency.   ACTIVITY: As tolerated.  FOLLOWUP: With physical therapy 1 to 2 days with doctor at rehab. Please see Ortho discharge instructions for wound care and followup appointment.   HOSPITAL COURSE: The patient was admitted 04/14/2013 with hip fracture and fall. Because he was on Xarelto, surgery was held until the 26th.   LABORATORY AND RADIOLOGICAL DATA DURING THE HOSPITAL COURSE: Included an INR of 1.9. Glucose 33, BUN 16, creatinine 1.17, sodium 140, potassium 2.9, chloride 104, CO2 of 28, calcium 8.3. Liver function tests: Albumin low at 3.1. White blood cell count 8.7, hemoglobin and hematocrit 13.3 and 39.9 and platelet count of 219. Right hip showed minimally displaced intertrochanteric right femur fracture.  Chest x-ray: No active disease. Urine culture grew out 40,000 Candida. Urinalysis: 3+ blood, otherwise negative. Right femur x-ray shows medullary rodding and compression screw now seen in anatomic alignment. Creatinine upon discharge 1.19, GFR 54, hemoglobin 10.3, platelet count 187.   HOSPITAL COURSE PER PROBLEM LIST:  1.  For the patient's atrial fibrillation with history of sick sinus syndrome and pacer, the patient is on 3 medications to control heart rate. Heart rate currently 85. Blood pressure remains on the lower side. Need to have heart rate controlled. Anticoagulation with Xarelto was restarted. I am hesitant on an 81 year old with Xarelto. I will keep the Xarelto on now to prevent DVT while in the rehab and while being watched, but on discharge from the rehab need to assess his capabilities of moving around and steadiness with his gait. If he is unsteady with his gait, may be able to switch to aspirin 325 mg and get rid of the Xarelto. If he is steady with his gait,  then you can continue with the Xarelto. Benefits and risks always need to be discussed on blood thinners, especially in the elderly.  2.  History of coronary artery disease, asymptomatic during the hospitalization.  3.  Right hip fracture, status post repair by Dr. Ernest PineHooten. Ortho discharge instructions written on pain medications as needed and followup appointments. Physical therapy will be difficult with history of Parkinson's and dementia, but needs to be done.  4.  Dementia. The patient is on Aricept.  5.  Parkinson's, not on any medications for this.  6.  BPH, on Flomax.  7.  Constipation on p.r.n. medications and Colace senna.   TIME SPENT ON DISCHARGE: 35 minutes.  ____________________________ Herschell Dimes. Renae Gloss, MD rjw:aw D: 04/21/2013 10:03:06 ET T: 04/21/2013 10:12:28 ET JOB#: [id number]  cc: Herschell Dimes. Renae Gloss, MD, <Dictator> Salley Scarlet MD ELECTRONICALLY SIGNED 04/21/2013 15:44

## 2014-08-15 NOTE — Op Note (Signed)
PATIENT NAME:  Matthew Floyd, Rajeev MR#:  [id number] DATE OF BIRTH:  06/10/1922  DATE OF PROCEDURE:  04/18/2013  PREOPERATIVE DIAGNOSIS: Right intertrochanteric femur fracture.   POSTOPERATIVE DIAGNOSIS: Right intertrochanteric femur fracture.   PROCEDURE PERFORMED: Open reduction internal fixation of right intertrochanteric femur fracture.   SURGEON: Illene LabradorJames P. Hooten, MD  ANESTHESIA: Spinal.   ESTIMATED BLOOD LOSS: 25 mL.   FLUIDS REPLACED: 600 mL of crystalloid.   DRAINS: None.   IMPLANTS UTILIZED: Synthes 11 mm x 440 mm, 130 degree trochanteric fixation nail, 105 mm blade plate, and a 5.0 x 58 mm locking screw.   INDICATIONS FOR SURGERY: The patient is an 81 year old male with a history of dementia who fell 4 days ago and landed on his right hip and leg. X-rays demonstrated a right intertrochanteric femur fracture. Surgery was delayed due to the patient's anticoagulation with Xarelto. The findings were discussed in detail with the patient and his family. They expressed understanding of the risks and benefits and agreed with plans for surgical intervention.   PROCEDURE IN DETAIL: The patient was brought into the operating room and, after adequate spinal anesthesia was achieved, the patient was placed on the fracture table. Traction was applied to the right lower extremity and all bony prominences were well padded. Provisional reduction was performed and position confirmed in both AP and lateral planes using C-arm. The patient's right hip and leg were cleaned and prepped with alcohol and DuraPrep draped in the usual sterile fashion. A "timeout" was performed as per usual protocol. A lateral longitudinal incision was made extending from the tip of the trochanter proximally. Fascia was incised, and dissection was carried down to the tip of the greater trochanter. A distally threaded guidepin was inserted into the femoral canal. Position was confirmed in both AP and lateral planes using the C-arm. A step  drill was used to enlarge the opening. The distally threaded guide pin was replaced with a beaded guidewire and position was confirmed in both AP and lateral planes. Measurements were obtained and it was felt that a 440 mm long nail was appropriate. The canal was then reamed in a sequential fashion up to a 12 mm diameter. An 11 mm x 440 mm, 130 degree trochanteric fixation nail was then advanced over the guidewire. Good position was noted. Guidewire was removed. A second stab incision was made, and a tissue protector was advanced through the outrigger device up to the lateral femoral cortex. A distally threaded guide pin was then advanced into the femoral neck and head. Good position was noted in both AP and lateral planes. Measurements were obtained and it was felt that 105 mm helical blade was appropriate. Cortex was reamed and then cannulated reamer set at 105 mm was advanced over the guidepin. Next, 105 mm helical blade was positioned and impacted into place. Good purchase was appreciated. Guidewire was removed. Proximal locking sleeve was engaged. Finally, the C-arm was positioned so as to visualize the distal portion of the nail and lateral position. A stab incision was made and a 5 mm x 58 mm locking screw was then inserted. Good position was noted in both AP and lateral planes. Outrigger device was removed and proximal portion of the fracture was also noted to be well reduced with hardware in good position.   The wounds were irrigated with copious amounts of normal saline with antibiotic solution and then suctioned dry. Good hemostasis was appreciated. Fascia was closed using interrupted sutures of #1 Vicryl. The subcutaneous tissue was  approximated in layers using first #0 Vicryl followed by 2-0 Vicryl. Skin was closed with skin staples. Sterile dressing was applied.   The patient tolerated the procedure well. He was transported to the recovery room in stable condition.     ____________________________ Illene Labrador. Angie Fava., MD jph:dp D: 04/18/2013 14:10:15 ET T: 04/18/2013 14:57:03 ET JOB#: [id number]  cc: Fayrene Fearing P. Angie Fava., MD, <Dictator> Illene Labrador Angie Fava MD ELECTRONICALLY SIGNED 04/27/2013 15:58

## 2015-05-20 IMAGING — CT CT FOOT*R* W/CM
3 series · 12 of 33 positions shown, 14 images · IV contrast (omnipaque)
Comparison: None.

CLINICAL DATA: Osteomyelitis of the foot. Cardiac pacemaker
precludes MRI.

EXAM:
CT OF THE RIGHT FOOT WITH CONTRAST
TECHNIQUE: Multidetector CT imaging was performed following the standard
protocol during bolus administration of intravenous contrast.
CONTRAST:  80mL OMNIPAQUE IOHEXOL 300 MG/ML  SOLN

[Series 7: lfov ext 3.0 b40s · axial · 0.64mm/px · z∈[+172,+292]mm · 4 of 58 slices shown, 5 images]
[im 9/58  soft-tissue]
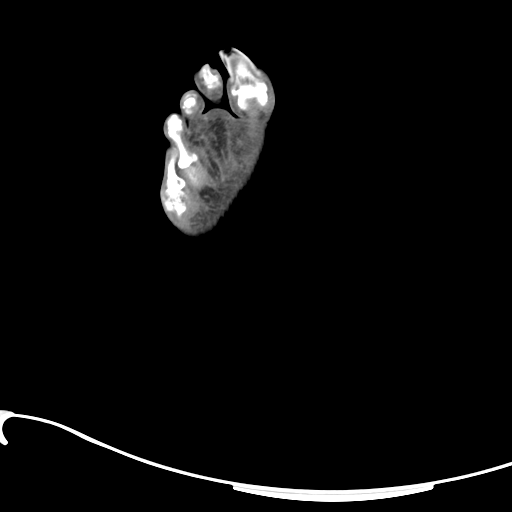
[im 9/58  bone]
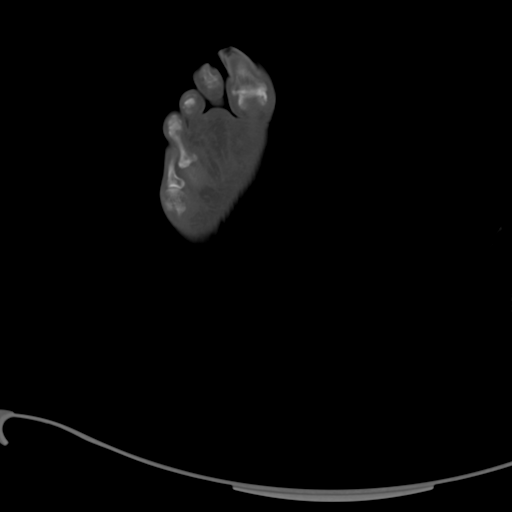
[im 22/58  bone]
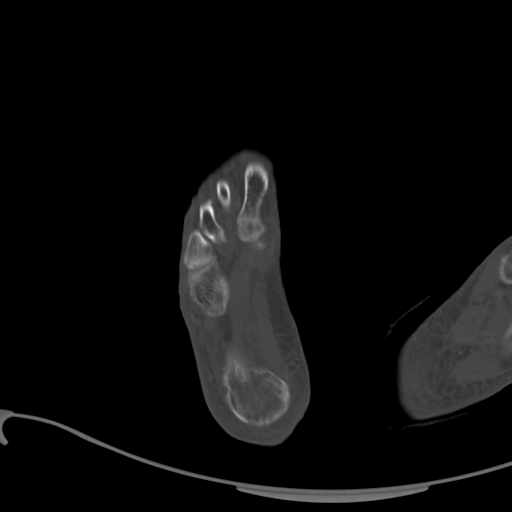
[im 36/58  bone]
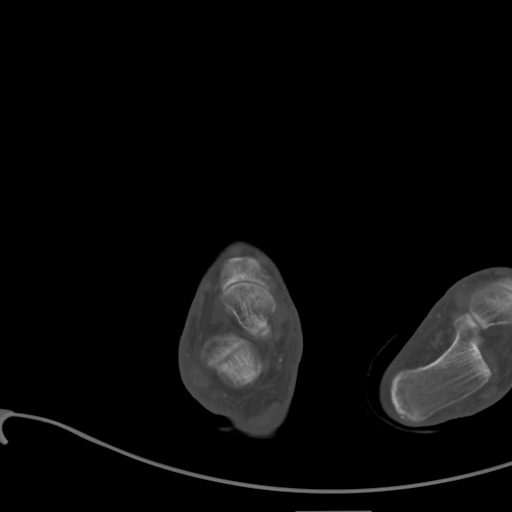
[im 49/58  bone]
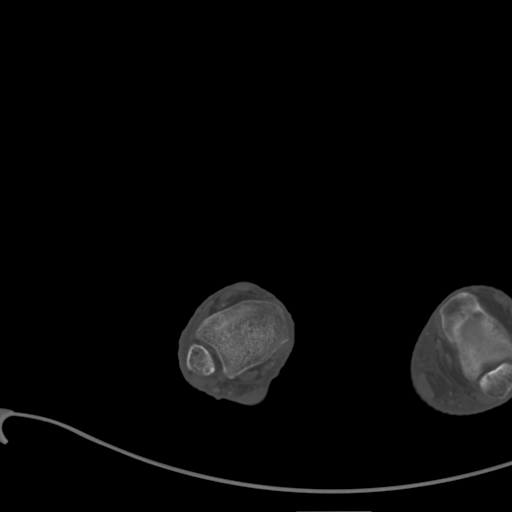

[Series 10: coronalsoft tissue · coronal · 0.34mm/px · 3 of 133 slices shown]
[im 27/133  bone]
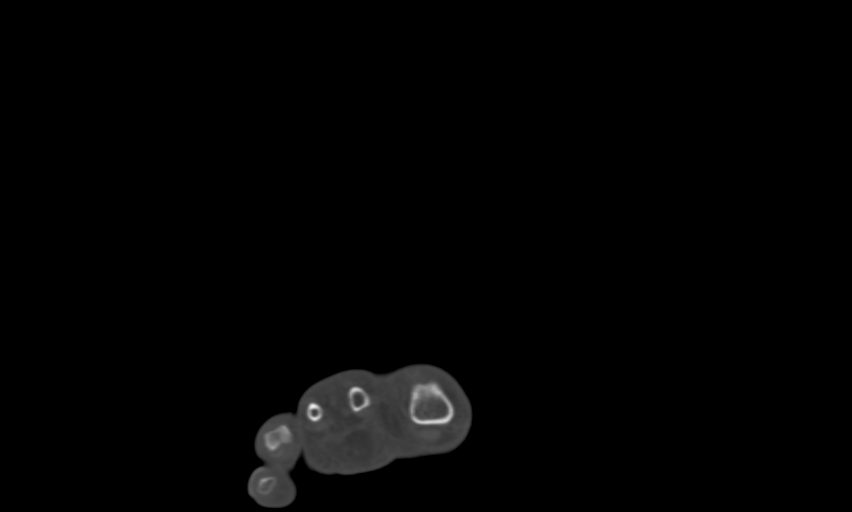
[im 53/133  bone]
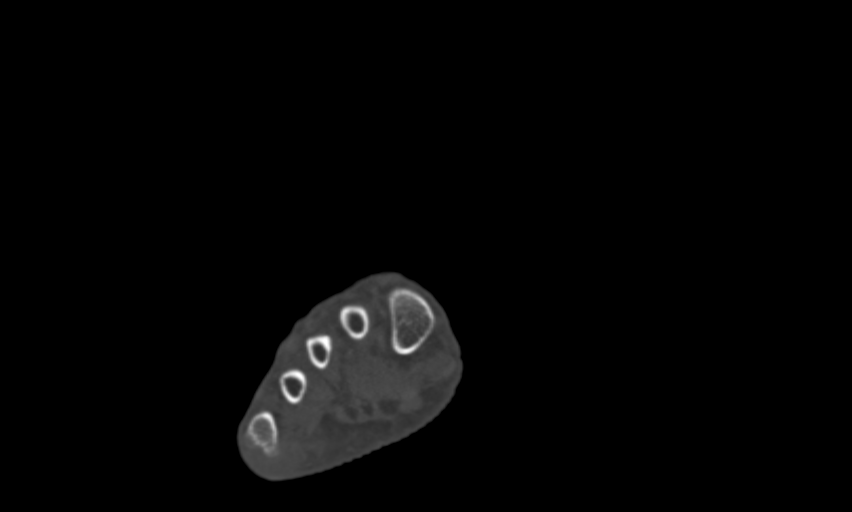
[im 80/133  bone]
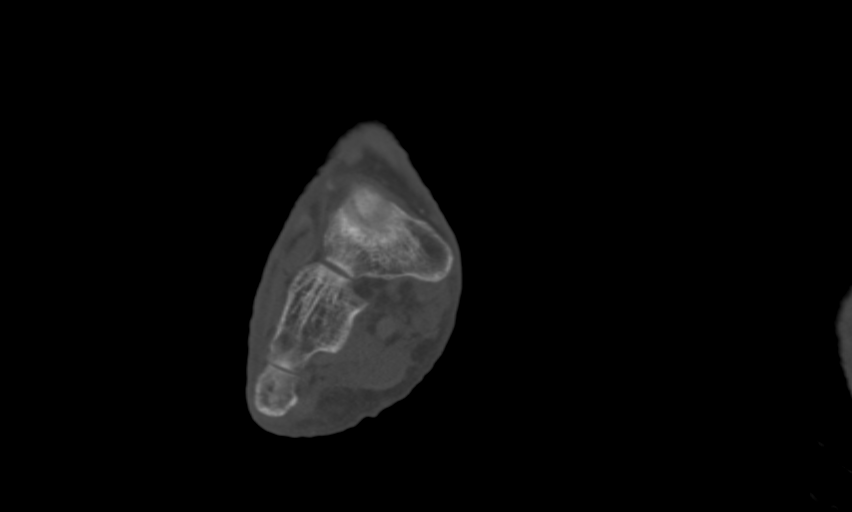

[Series 11: sagittalsoft tissue · sagittal · 0.41mm/px · 5 of 44 slices shown, 6 images]
[im 15/44  bone]
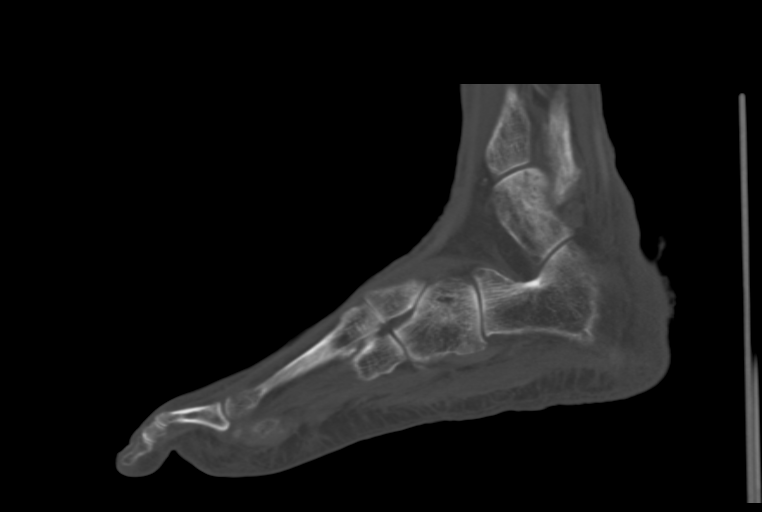
[im 18/44  bone]
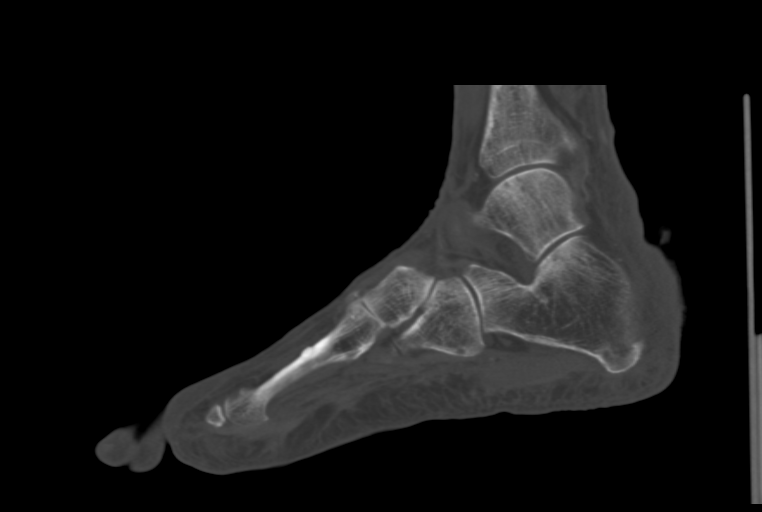
[im 22/44  soft-tissue]
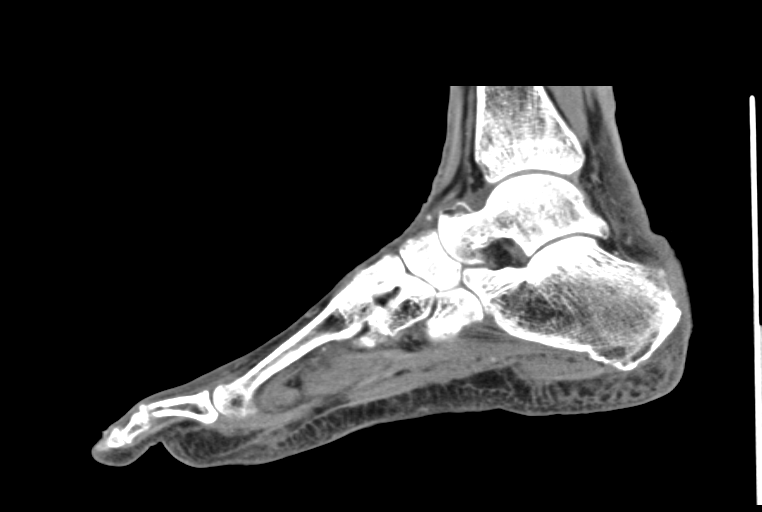
[im 22/44  bone]
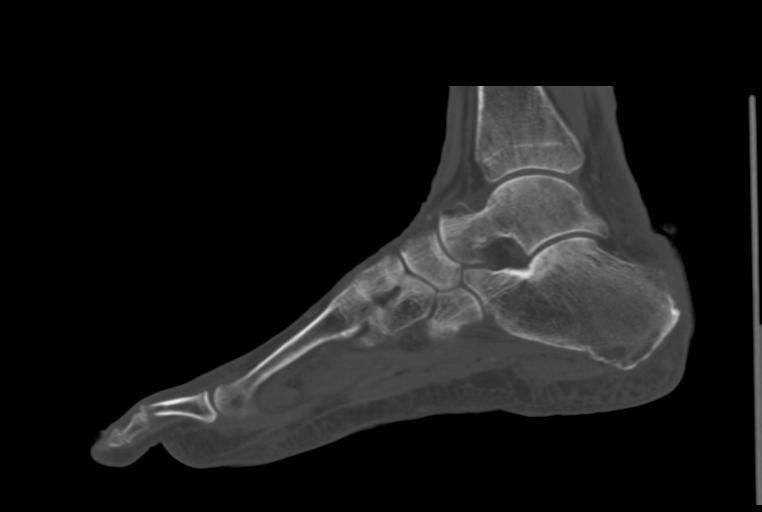
[im 26/44  bone]
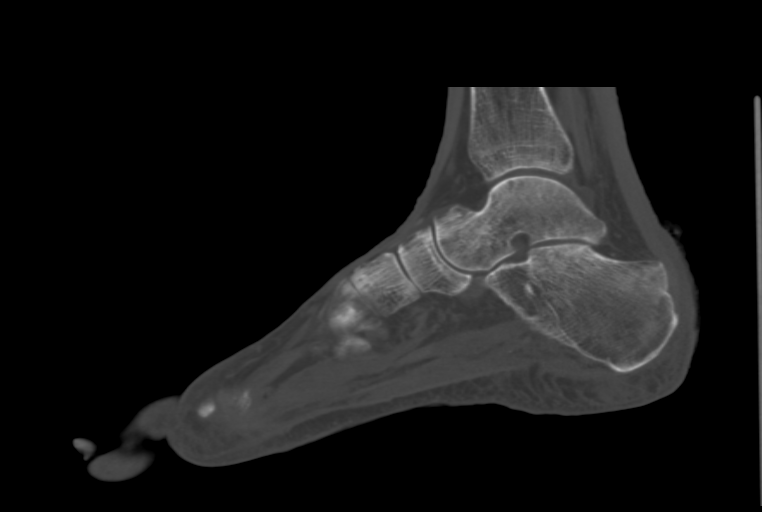
[im 29/44  bone]
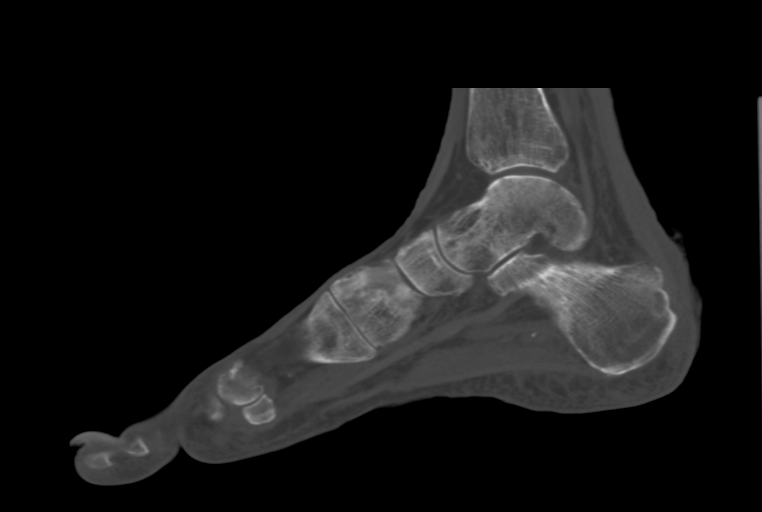

[12 of 33 positions shown; findings below may reference images not displayed]

FINDINGS: With soft tissue swelling noted in the ankle particularly laterally,
and tracking in the lateral dorsum of the foot.

No bony destructive findings in the foot characteristic of
osteomyelitis. Chronic appearing and well corticated erosions along
the medial head of the great toe are suspicious for gouty erosions.
There is soft tissue swelling in the subcutaneous tissues plantar to
the fifth metatarsophalangeal joint.

No compelling findings of abscess.
IMPRESSION: 1. Subcutaneous edema along the lateral ankle and adjacent to the
fifth metatarsophalangeal joint may reflect cellulitis, but without
bony destructive findings characteristic of osteomyelitis.
Three-phase bone scan can provide complementary information if
clinically warranted. MRI is contraindicated by the patient's
pacemaker.
2. Well corticated medial erosions along the head of the first
metatarsal could reflect gout arthropathy.
